# Patient Record
Sex: Male | Born: 1950
Health system: Southern US, Community
[De-identification: ages and names within clinical notes are randomized; demographics above are authoritative.]

---

## 2009-02-13 ENCOUNTER — Ambulatory Visit: Payer: Self-pay | Admitting: Internal Medicine

## 2010-02-17 ENCOUNTER — Ambulatory Visit: Payer: Self-pay | Admitting: Internal Medicine

## 2011-08-23 ENCOUNTER — Other Ambulatory Visit: Payer: Self-pay | Admitting: Internal Medicine

## 2011-08-25 ENCOUNTER — Encounter: Payer: Self-pay | Admitting: Internal Medicine

## 2011-09-15 ENCOUNTER — Other Ambulatory Visit: Payer: BC Managed Care – PPO | Admitting: Internal Medicine

## 2011-09-15 DIAGNOSIS — Z0000 Encounter for general adult medical examination without abnormal findings: Secondary | ICD-10-CM

## 2011-09-15 LAB — CBC WITH DIFFERENTIAL/PLATELET
Basophils Absolute: 0 10*3/uL (ref 0.0–0.1)
Lymphocytes Relative: 31 % (ref 12–46)
Neutro Abs: 3.6 10*3/uL (ref 1.7–7.7)
Platelets: 193 10*3/uL (ref 150–400)
RDW: 13.6 % (ref 11.5–15.5)
WBC: 6.2 10*3/uL (ref 4.0–10.5)

## 2011-09-15 LAB — COMPREHENSIVE METABOLIC PANEL
ALT: 53 U/L (ref 0–53)
AST: 38 U/L — ABNORMAL HIGH (ref 0–37)
Albumin: 4.4 g/dL (ref 3.5–5.2)
Calcium: 9.5 mg/dL (ref 8.4–10.5)
Chloride: 106 mEq/L (ref 96–112)
Creat: 1.05 mg/dL (ref 0.50–1.35)
Potassium: 4.3 mEq/L (ref 3.5–5.3)

## 2011-09-15 LAB — LIPID PANEL
Total CHOL/HDL Ratio: 3.6 Ratio
VLDL: 23 mg/dL (ref 0–40)

## 2011-09-16 ENCOUNTER — Other Ambulatory Visit: Payer: Self-pay | Admitting: Internal Medicine

## 2011-09-22 ENCOUNTER — Encounter: Payer: Self-pay | Admitting: Internal Medicine

## 2011-09-22 ENCOUNTER — Ambulatory Visit (INDEPENDENT_AMBULATORY_CARE_PROVIDER_SITE_OTHER): Payer: BC Managed Care – PPO | Admitting: Internal Medicine

## 2011-09-22 VITALS — BP 134/72 | HR 74 | Temp 97.8°F | Resp 16 | Ht 70.0 in | Wt 196.0 lb

## 2011-09-22 DIAGNOSIS — Z23 Encounter for immunization: Secondary | ICD-10-CM

## 2011-09-22 DIAGNOSIS — E785 Hyperlipidemia, unspecified: Secondary | ICD-10-CM

## 2011-09-22 DIAGNOSIS — Z0000 Encounter for general adult medical examination without abnormal findings: Secondary | ICD-10-CM

## 2011-09-22 LAB — POCT URINALYSIS DIPSTICK
Blood, UA: NEGATIVE
Glucose, UA: NEGATIVE
Spec Grav, UA: 1.02

## 2011-10-04 ENCOUNTER — Encounter: Payer: Self-pay | Admitting: Internal Medicine

## 2011-10-04 DIAGNOSIS — E785 Hyperlipidemia, unspecified: Secondary | ICD-10-CM | POA: Insufficient documentation
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
## 2011-10-04 NOTE — Patient Instructions (Signed)
Continue diet exercise and return in one year or as needed

## 2012-05-09 ENCOUNTER — Telehealth: Payer: Self-pay | Admitting: Internal Medicine

## 2012-05-09 NOTE — Telephone Encounter (Signed)
Erroneous patient pulled up.  Appointment given for 12/6, not a message needed.

## 2012-05-11 ENCOUNTER — Encounter: Payer: Self-pay | Admitting: Internal Medicine

## 2012-05-11 ENCOUNTER — Ambulatory Visit (INDEPENDENT_AMBULATORY_CARE_PROVIDER_SITE_OTHER): Payer: BC Managed Care – PPO | Admitting: Internal Medicine

## 2012-05-11 VITALS — BP 130/90 | HR 84 | Temp 98.0°F | Wt 198.0 lb

## 2012-05-11 DIAGNOSIS — M6740 Ganglion, unspecified site: Secondary | ICD-10-CM

## 2012-05-11 DIAGNOSIS — M67439 Ganglion, unspecified wrist: Secondary | ICD-10-CM

## 2012-05-11 NOTE — Progress Notes (Signed)
  Subjective:    Patient ID: James Hughes, male    DOB: 21-Oct-1950, 61 y.o.   MRN: [id number]  HPI Patient recently noted cystic lesion left wrist. Not tender to touch. Not painful. Appeared rather suddenly.    Review of Systems     Objective:   Physical Exam 1.5 cm cystic lesion consistent with ganglion cyst left wrist. Not red. Not inflamed. Not painful to touch. No increased warmth. Full range of motion left wrist       Assessment & Plan:  Ganglion cyst left wrist  Plan: Explained to patient that this is a benign lesion and can be left alone unless symptomatic. He is concerned it might become irritated using a computer mouse. Suggested he observe for now and let he know if he would like to see hand surgeon regarding excision in the future.

## 2012-05-11 NOTE — Patient Instructions (Addendum)
Continue close observation and advise [place] if you like to be referred for excision of cyst

## 2012-12-20 ENCOUNTER — Other Ambulatory Visit: Payer: BC Managed Care – PPO | Admitting: Internal Medicine

## 2012-12-20 DIAGNOSIS — Z125 Encounter for screening for malignant neoplasm of prostate: Secondary | ICD-10-CM

## 2012-12-20 DIAGNOSIS — Z13220 Encounter for screening for lipoid disorders: Secondary | ICD-10-CM

## 2012-12-20 DIAGNOSIS — Z130 Encounter for screening for diseases of the blood and blood-forming organs and certain disorders involving the immune mechanism: Secondary | ICD-10-CM

## 2012-12-20 DIAGNOSIS — Z0000 Encounter for general adult medical examination without abnormal findings: Secondary | ICD-10-CM

## 2012-12-20 LAB — CBC WITH DIFFERENTIAL/PLATELET
Eosinophils Absolute: 0.1 10*3/uL (ref 0.0–0.7)
Eosinophils Relative: 1 % (ref 0–5)
HCT: 42.8 % (ref 39.0–52.0)
Lymphocytes Relative: 31 % (ref 12–46)
Lymphs Abs: 1.7 10*3/uL (ref 0.7–4.0)
MCH: 32.1 pg (ref 26.0–34.0)
MCV: 90.5 fL (ref 78.0–100.0)
Monocytes Absolute: 0.4 10*3/uL (ref 0.1–1.0)
Monocytes Relative: 8 % (ref 3–12)
RBC: 4.73 MIL/uL (ref 4.22–5.81)
WBC: 5.5 10*3/uL (ref 4.0–10.5)

## 2012-12-20 LAB — COMPREHENSIVE METABOLIC PANEL
ALT: 38 U/L (ref 0–53)
BUN: 14 mg/dL (ref 6–23)
CO2: 23 mEq/L (ref 19–32)
Calcium: 9.3 mg/dL (ref 8.4–10.5)
Chloride: 106 mEq/L (ref 96–112)
Creat: 0.98 mg/dL (ref 0.50–1.35)
Glucose, Bld: 91 mg/dL (ref 70–99)

## 2012-12-20 LAB — LIPID PANEL: Cholesterol: 176 mg/dL (ref 0–200)

## 2012-12-21 LAB — PSA: PSA: 2.85 ng/mL (ref <=4.00)

## 2012-12-25 ENCOUNTER — Encounter: Payer: Self-pay | Admitting: Internal Medicine

## 2012-12-25 ENCOUNTER — Ambulatory Visit (INDEPENDENT_AMBULATORY_CARE_PROVIDER_SITE_OTHER): Payer: BC Managed Care – PPO | Admitting: Internal Medicine

## 2012-12-25 VITALS — BP 112/80 | HR 84 | Temp 97.1°F | Ht 70.5 in | Wt 186.0 lb

## 2012-12-25 DIAGNOSIS — Z0000 Encounter for general adult medical examination without abnormal findings: Secondary | ICD-10-CM

## 2012-12-25 LAB — POCT URINALYSIS DIPSTICK
Bilirubin, UA: NEGATIVE
Ketones, UA: NEGATIVE
Leukocytes, UA: NEGATIVE
Nitrite, UA: NEGATIVE
Protein, UA: NEGATIVE
pH, UA: 6.5

## 2013-06-02 NOTE — Patient Instructions (Signed)
Continue diet and exercise. Recheck in one year. Colonoscopy due this year with gastroenterologist
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
## 2014-03-12 ENCOUNTER — Encounter: Payer: Self-pay | Admitting: Internal Medicine

## 2014-06-20 ENCOUNTER — Other Ambulatory Visit: Payer: BLUE CROSS/BLUE SHIELD | Admitting: Internal Medicine

## 2014-06-20 DIAGNOSIS — Z125 Encounter for screening for malignant neoplasm of prostate: Secondary | ICD-10-CM

## 2014-06-20 DIAGNOSIS — Z130 Encounter for screening for diseases of the blood and blood-forming organs and certain disorders involving the immune mechanism: Secondary | ICD-10-CM

## 2014-06-20 DIAGNOSIS — Z0000 Encounter for general adult medical examination without abnormal findings: Secondary | ICD-10-CM

## 2014-06-20 DIAGNOSIS — Z13220 Encounter for screening for lipoid disorders: Secondary | ICD-10-CM

## 2014-06-20 LAB — LIPID PANEL
CHOL/HDL RATIO: 3.4 ratio
CHOLESTEROL: 168 mg/dL (ref 0–200)
HDL: 49 mg/dL (ref >39)
LDL CALC: 95 mg/dL (ref 0–99)
TRIGLYCERIDES: 122 mg/dL (ref <150)
VLDL: 24 mg/dL (ref 0–40)

## 2014-06-20 LAB — COMPREHENSIVE METABOLIC PANEL
ALK PHOS: 65 U/L (ref 39–117)
ALT: 39 U/L (ref 0–53)
AST: 28 U/L (ref 0–37)
Albumin: 4 g/dL (ref 3.5–5.2)
BUN: 11 mg/dL (ref 6–23)
CO2: 25 meq/L (ref 19–32)
Calcium: 9.3 mg/dL (ref 8.4–10.5)
Chloride: 105 mEq/L (ref 96–112)
Creat: 0.94 mg/dL (ref 0.50–1.35)
Glucose, Bld: 98 mg/dL (ref 70–99)
POTASSIUM: 4.4 meq/L (ref 3.5–5.3)
SODIUM: 141 meq/L (ref 135–145)
TOTAL PROTEIN: 6.7 g/dL (ref 6.0–8.3)
Total Bilirubin: 1 mg/dL (ref 0.2–1.2)

## 2014-06-20 LAB — CBC WITH DIFFERENTIAL/PLATELET
BASOS ABS: 0.1 10*3/uL (ref 0.0–0.1)
Basophils Relative: 1 % (ref 0–1)
EOS PCT: 2 % (ref 0–5)
Eosinophils Absolute: 0.1 10*3/uL (ref 0.0–0.7)
HCT: 47 % (ref 39.0–52.0)
Hemoglobin: 15.9 g/dL (ref 13.0–17.0)
LYMPHS ABS: 1.4 10*3/uL (ref 0.7–4.0)
Lymphocytes Relative: 27 % (ref 12–46)
MCH: 31.1 pg (ref 26.0–34.0)
MCHC: 33.8 g/dL (ref 30.0–36.0)
MCV: 91.8 fL (ref 78.0–100.0)
MONO ABS: 0.5 10*3/uL (ref 0.1–1.0)
MONOS PCT: 9 % (ref 3–12)
MPV: 9.4 fL (ref 8.6–12.4)
NEUTROS PCT: 61 % (ref 43–77)
Neutro Abs: 3.1 10*3/uL (ref 1.7–7.7)
Platelets: 196 10*3/uL (ref 150–400)
RBC: 5.12 MIL/uL (ref 4.22–5.81)
RDW: 13.3 % (ref 11.5–15.5)
WBC: 5.1 10*3/uL (ref 4.0–10.5)

## 2014-06-21 LAB — PSA: PSA: 2.19 ng/mL (ref <=4.00)

## 2014-06-24 ENCOUNTER — Encounter: Payer: Self-pay | Admitting: Internal Medicine

## 2014-06-24 ENCOUNTER — Ambulatory Visit (INDEPENDENT_AMBULATORY_CARE_PROVIDER_SITE_OTHER): Payer: BLUE CROSS/BLUE SHIELD | Admitting: Internal Medicine

## 2014-06-24 VITALS — BP 136/76 | HR 72 | Temp 97.7°F | Wt 190.5 lb

## 2014-06-24 DIAGNOSIS — Z0000 Encounter for general adult medical examination without abnormal findings: Secondary | ICD-10-CM

## 2014-06-24 LAB — POCT URINALYSIS DIPSTICK
BILIRUBIN UA: NEGATIVE
Blood, UA: NEGATIVE
GLUCOSE UA: NEGATIVE
Ketones, UA: NEGATIVE
LEUKOCYTES UA: NEGATIVE
NITRITE UA: NEGATIVE
Protein, UA: NEGATIVE
Spec Grav, UA: 1.02
UROBILINOGEN UA: NEGATIVE
pH, UA: 7
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
## 2014-06-24 NOTE — Patient Instructions (Signed)
Come get Prevnar vaccine in a couple of weeks. Otherwise return in one year or as needed.

## 2014-06-25 ENCOUNTER — Encounter: Payer: Self-pay | Admitting: Internal Medicine

## 2014-07-03 ENCOUNTER — Encounter: Payer: Self-pay | Admitting: Internal Medicine

## 2014-07-11 ENCOUNTER — Ambulatory Visit (INDEPENDENT_AMBULATORY_CARE_PROVIDER_SITE_OTHER): Payer: BLUE CROSS/BLUE SHIELD | Admitting: Internal Medicine

## 2014-07-11 VITALS — BP 140/72 | Temp 97.8°F

## 2014-07-11 DIAGNOSIS — Z23 Encounter for immunization: Secondary | ICD-10-CM

## 2014-07-11 NOTE — Progress Notes (Signed)
Patient presents today for Prevnar vaccine . Tolerated injection well

## 2015-03-23 ENCOUNTER — Ambulatory Visit (INDEPENDENT_AMBULATORY_CARE_PROVIDER_SITE_OTHER): Payer: BLUE CROSS/BLUE SHIELD | Admitting: Internal Medicine

## 2015-03-23 ENCOUNTER — Encounter: Payer: Self-pay | Admitting: Internal Medicine

## 2015-03-23 VITALS — BP 152/88 | HR 89 | Temp 97.5°F | Wt 178.0 lb

## 2015-03-23 DIAGNOSIS — J069 Acute upper respiratory infection, unspecified: Secondary | ICD-10-CM

## 2015-03-23 DIAGNOSIS — H6501 Acute serous otitis media, right ear: Secondary | ICD-10-CM | POA: Diagnosis not present

## 2015-03-23 MED ORDER — AZITHROMYCIN 250 MG PO TABS: ORAL_TABLET | ORAL | Status: DC

## 2015-03-23 NOTE — Progress Notes (Signed)
   Subjective:    Patient ID: James Hughes, male    DOB: Oct 07, 1950, 64 y.o.   MRN: [id number]  HPI Patient has come down with URI symptoms. Has malaise and fatigue and some scratchy throat with postnasal drip. No fever or cough.    Review of Systems     Objective:   Physical Exam Pharynx slightly injected without exudate. Right TM is full but not red. Left TM is clear. Neck is supple without adenopathy. Chest clear to auscultation.       Assessment & Plan:  Acute URI  Acute right serous otitis media    Plan: Zithromax Z-PAK take 2 tablets day one followed by 1 tablet days 2 through 5. Call if not better in 7-10 days or sooner if worse. Patient declined offer for cough medication.  15 minutes spent with patient

## 2015-03-23 NOTE — Patient Instructions (Signed)
It was pleasure to see you today. Takes Zithromax Z-Pak is prescribed. Call if not better in 7-10 days or sooner if worse. Rest and drink plenty of fluids.

## 2016-03-14 ENCOUNTER — Other Ambulatory Visit (INDEPENDENT_AMBULATORY_CARE_PROVIDER_SITE_OTHER): Payer: BLUE CROSS/BLUE SHIELD | Admitting: Internal Medicine

## 2016-03-14 DIAGNOSIS — L814 Other melanin hyperpigmentation: Secondary | ICD-10-CM | POA: Diagnosis not present

## 2016-03-14 DIAGNOSIS — Z125 Encounter for screening for malignant neoplasm of prostate: Secondary | ICD-10-CM

## 2016-03-14 DIAGNOSIS — Z1329 Encounter for screening for other suspected endocrine disorder: Secondary | ICD-10-CM

## 2016-03-14 DIAGNOSIS — Z131 Encounter for screening for diabetes mellitus: Secondary | ICD-10-CM | POA: Diagnosis not present

## 2016-03-14 DIAGNOSIS — Z86018 Personal history of other benign neoplasm: Secondary | ICD-10-CM | POA: Diagnosis not present

## 2016-03-14 DIAGNOSIS — Z23 Encounter for immunization: Secondary | ICD-10-CM | POA: Diagnosis not present

## 2016-03-14 DIAGNOSIS — D225 Melanocytic nevi of trunk: Secondary | ICD-10-CM | POA: Diagnosis not present

## 2016-03-14 DIAGNOSIS — D1801 Hemangioma of skin and subcutaneous tissue: Secondary | ICD-10-CM | POA: Diagnosis not present

## 2016-03-14 DIAGNOSIS — L821 Other seborrheic keratosis: Secondary | ICD-10-CM | POA: Diagnosis not present

## 2016-03-14 LAB — CBC WITH DIFFERENTIAL/PLATELET
BASOS PCT: 1 %
Basophils Absolute: 51 cells/uL (ref 0–200)
Eosinophils Absolute: 51 cells/uL (ref 15–500)
Eosinophils Relative: 1 %
HCT: 46.7 % (ref 38.5–50.0)
Hemoglobin: 16 g/dL (ref 13.2–17.1)
LYMPHS PCT: 27 %
Lymphs Abs: 1377 cells/uL (ref 850–3900)
MCH: 32.3 pg (ref 27.0–33.0)
MCHC: 34.3 g/dL (ref 32.0–36.0)
MCV: 94.2 fL (ref 80.0–100.0)
MONO ABS: 459 {cells}/uL (ref 200–950)
MPV: 9.8 fL (ref 7.5–12.5)
Monocytes Relative: 9 %
Neutro Abs: 3162 cells/uL (ref 1500–7800)
Neutrophils Relative %: 62 %
PLATELETS: 190 10*3/uL (ref 140–400)
RBC: 4.96 MIL/uL (ref 4.20–5.80)
RDW: 13 % (ref 11.0–15.0)
WBC: 5.1 10*3/uL (ref 3.8–10.8)

## 2016-03-14 LAB — COMPREHENSIVE METABOLIC PANEL
ALBUMIN: 4.2 g/dL (ref 3.6–5.1)
ALT: 47 U/L — ABNORMAL HIGH (ref 9–46)
AST: 37 U/L — ABNORMAL HIGH (ref 10–35)
Alkaline Phosphatase: 56 U/L (ref 40–115)
BUN: 16 mg/dL (ref 7–25)
CALCIUM: 9.4 mg/dL (ref 8.6–10.3)
CHLORIDE: 103 mmol/L (ref 98–110)
CO2: 24 mmol/L (ref 20–31)
CREATININE: 1.09 mg/dL (ref 0.70–1.25)
Glucose, Bld: 82 mg/dL (ref 65–99)
POTASSIUM: 4.4 mmol/L (ref 3.5–5.3)
SODIUM: 140 mmol/L (ref 135–146)
TOTAL PROTEIN: 6.8 g/dL (ref 6.1–8.1)
Total Bilirubin: 2.2 mg/dL — ABNORMAL HIGH (ref 0.2–1.2)

## 2016-03-14 LAB — LIPID PANEL
Cholesterol: 179 mg/dL (ref 125–200)
HDL: 72 mg/dL (ref >=40)
LDL Cholesterol: 89 mg/dL (ref <130)
TRIGLYCERIDES: 92 mg/dL (ref <150)
Total CHOL/HDL Ratio: 2.5 Ratio (ref <=5.0)
VLDL: 18 mg/dL (ref <30)

## 2016-03-14 LAB — PSA: PSA: 3 ng/mL (ref <=4.0)

## 2016-03-15 ENCOUNTER — Ambulatory Visit (INDEPENDENT_AMBULATORY_CARE_PROVIDER_SITE_OTHER): Payer: Medicare Other | Admitting: Internal Medicine

## 2016-03-15 ENCOUNTER — Encounter: Payer: Self-pay | Admitting: Internal Medicine

## 2016-03-15 VITALS — BP 138/90 | HR 87 | Ht 70.0 in | Wt 172.5 lb

## 2016-03-15 DIAGNOSIS — R17 Unspecified jaundice: Secondary | ICD-10-CM

## 2016-03-15 DIAGNOSIS — Z23 Encounter for immunization: Secondary | ICD-10-CM | POA: Diagnosis not present

## 2016-03-15 DIAGNOSIS — Z0000 Encounter for general adult medical examination without abnormal findings: Secondary | ICD-10-CM | POA: Diagnosis not present

## 2016-03-15 NOTE — Patient Instructions (Addendum)
It was a pleasure to see you today. Have ultrasound of liver. Further recommendations to follow.
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
## 2016-03-16 ENCOUNTER — Other Ambulatory Visit: Payer: Self-pay | Admitting: *Deleted

## 2016-03-16 ENCOUNTER — Telehealth: Payer: Self-pay | Admitting: *Deleted

## 2016-03-16 DIAGNOSIS — R7989 Other specified abnormal findings of blood chemistry: Secondary | ICD-10-CM

## 2016-03-16 DIAGNOSIS — R945 Abnormal results of liver function studies: Principal | ICD-10-CM

## 2016-03-16 NOTE — Telephone Encounter (Signed)
lvm informing pt of ultrasound appt. [place] location[facility] 03/28/16 at 10am Nothing to eat or drink after midnight the night before.

## 2016-03-28 ENCOUNTER — Ambulatory Visit
Admission: RE | Admit: 2016-03-28 | Discharge: 2016-03-28 | Disposition: A | Discharge status: Home / Self Care | Payer: Medicare Other | Source: Ambulatory Visit | Attending: Internal Medicine | Admitting: Internal Medicine

## 2016-03-28 DIAGNOSIS — R7989 Other specified abnormal findings of blood chemistry: Secondary | ICD-10-CM

## 2016-03-28 DIAGNOSIS — K7689 Other specified diseases of liver: Secondary | ICD-10-CM | POA: Diagnosis not present

## 2016-03-28 DIAGNOSIS — R945 Abnormal results of liver function studies: Principal | ICD-10-CM

## 2016-03-29 ENCOUNTER — Telehealth: Payer: Self-pay | Admitting: *Deleted

## 2016-03-29 NOTE — Telephone Encounter (Signed)
Pt informed, appt made

## 2016-03-29 NOTE — Telephone Encounter (Signed)
-----   Message from Elby Showers, MD sent at 03/28/2016  7:16 PM EDT ----- Ultrasound of liver shows benign cyst. Not a reason for elevated LFTs. Suggest we repeat LFT's without office visit in 4 weeks.

## 2016-04-05 DIAGNOSIS — H524 Presbyopia: Secondary | ICD-10-CM | POA: Diagnosis not present

## 2016-04-05 DIAGNOSIS — H52223 Regular astigmatism, bilateral: Secondary | ICD-10-CM | POA: Diagnosis not present

## 2016-04-05 DIAGNOSIS — H5203 Hypermetropia, bilateral: Secondary | ICD-10-CM | POA: Diagnosis not present

## 2016-04-11 ENCOUNTER — Other Ambulatory Visit: Payer: Medicare Other | Admitting: Internal Medicine

## 2016-04-11 DIAGNOSIS — R7989 Other specified abnormal findings of blood chemistry: Secondary | ICD-10-CM | POA: Diagnosis not present

## 2016-04-11 DIAGNOSIS — R17 Unspecified jaundice: Secondary | ICD-10-CM

## 2016-04-11 DIAGNOSIS — R945 Abnormal results of liver function studies: Principal | ICD-10-CM

## 2016-04-11 LAB — HEPATIC FUNCTION PANEL
ALT: 43 U/L (ref 9–46)
AST: 35 U/L (ref 10–35)
Albumin: 4 g/dL (ref 3.6–5.1)
Alkaline Phosphatase: 51 U/L (ref 40–115)
BILIRUBIN DIRECT: 0.2 mg/dL (ref <=0.2)
BILIRUBIN INDIRECT: 1 mg/dL (ref 0.2–1.2)
BILIRUBIN TOTAL: 1.2 mg/dL (ref 0.2–1.2)
Total Protein: 6.3 g/dL (ref 6.1–8.1)

## 2016-04-11 LAB — BILIRUBIN, FRACTIONATED(TOT/DIR/INDIR)
Bilirubin, Direct: 0.2 mg/dL (ref <=0.2)
Indirect Bilirubin: 1 mg/dL (ref 0.2–1.2)
Total Bilirubin: 1.2 mg/dL (ref 0.2–1.2)

## 2016-04-11 LAB — BILIRUBIN,DIRECT & INDIRECT (FRACTIONATED)
BILIRUBIN DIRECT: 0.2 mg/dL (ref <=0.2)
BILIRUBIN INDIRECT: 1 mg/dL (ref 0.2–1.2)

## 2016-07-11 ENCOUNTER — Encounter: Payer: Self-pay | Admitting: Internal Medicine

## 2016-07-11 ENCOUNTER — Ambulatory Visit (INDEPENDENT_AMBULATORY_CARE_PROVIDER_SITE_OTHER): Payer: Medicare Other | Admitting: Internal Medicine

## 2016-07-11 VITALS — BP 150/90 | HR 92 | Temp 98.0°F

## 2016-07-11 DIAGNOSIS — Z23 Encounter for immunization: Secondary | ICD-10-CM

## 2016-07-11 DIAGNOSIS — R030 Elevated blood-pressure reading, without diagnosis of hypertension: Secondary | ICD-10-CM

## 2016-07-11 NOTE — Patient Instructions (Signed)
Keep an eye on blood pressure the next couple weeks and call with readings. Pneumococcal 23 vaccine given.

## 2016-07-11 NOTE — Progress Notes (Signed)
   Subjective:    Patient ID: James Hughes, male    DOB: 1950-11-09, 66 y.o.   MRN: [id number]  HPI  For pneumococcal vaccine today. Had 2 large cups of coffee before coming. BP elevated at 150/90. He has no prior history of hypertension. He is in very good health. He exercises regularly.    Review of Systems     Objective:   Physical Exam  As above      Assessment & Plan:  Elevated blood pressure  Pneumococcal 23 vaccine  Plan: He's to keep an eye on his blood pressure and call me with some readings in a couple of weeks.

## 2017-03-07 DIAGNOSIS — Z23 Encounter for immunization: Secondary | ICD-10-CM | POA: Diagnosis not present

## 2017-03-15 DIAGNOSIS — L814 Other melanin hyperpigmentation: Secondary | ICD-10-CM | POA: Diagnosis not present

## 2017-03-15 DIAGNOSIS — D1801 Hemangioma of skin and subcutaneous tissue: Secondary | ICD-10-CM | POA: Diagnosis not present

## 2017-03-15 DIAGNOSIS — Z86018 Personal history of other benign neoplasm: Secondary | ICD-10-CM | POA: Diagnosis not present

## 2017-03-15 DIAGNOSIS — D225 Melanocytic nevi of trunk: Secondary | ICD-10-CM | POA: Diagnosis not present

## 2017-03-15 DIAGNOSIS — L821 Other seborrheic keratosis: Secondary | ICD-10-CM | POA: Diagnosis not present

## 2017-03-15 DIAGNOSIS — Z23 Encounter for immunization: Secondary | ICD-10-CM | POA: Diagnosis not present

## 2017-03-17 DIAGNOSIS — H43393 Other vitreous opacities, bilateral: Secondary | ICD-10-CM | POA: Diagnosis not present

## 2017-03-18 ENCOUNTER — Emergency Department (HOSPITAL_COMMUNITY): Payer: Medicare Other

## 2017-03-18 ENCOUNTER — Inpatient Hospital Stay (HOSPITAL_COMMUNITY)
Admission: EM | Admit: 2017-03-18 | Discharge: 2017-03-20 | DRG: 176 | Disposition: A | Discharge status: Home / Self Care | Payer: Medicare Other | Attending: Internal Medicine | Admitting: Internal Medicine

## 2017-03-18 ENCOUNTER — Encounter (HOSPITAL_COMMUNITY): Payer: Self-pay | Admitting: Emergency Medicine

## 2017-03-18 DIAGNOSIS — R778 Other specified abnormalities of plasma proteins: Secondary | ICD-10-CM

## 2017-03-18 DIAGNOSIS — Z880 Allergy status to penicillin: Secondary | ICD-10-CM

## 2017-03-18 DIAGNOSIS — I10 Essential (primary) hypertension: Secondary | ICD-10-CM

## 2017-03-18 DIAGNOSIS — R748 Abnormal levels of other serum enzymes: Secondary | ICD-10-CM

## 2017-03-18 DIAGNOSIS — Z86711 Personal history of pulmonary embolism: Secondary | ICD-10-CM | POA: Diagnosis not present

## 2017-03-18 DIAGNOSIS — I248 Other forms of acute ischemic heart disease: Secondary | ICD-10-CM | POA: Diagnosis not present

## 2017-03-18 DIAGNOSIS — R7989 Other specified abnormal findings of blood chemistry: Secondary | ICD-10-CM

## 2017-03-18 DIAGNOSIS — R000 Tachycardia, unspecified: Secondary | ICD-10-CM | POA: Diagnosis not present

## 2017-03-18 DIAGNOSIS — R030 Elevated blood-pressure reading, without diagnosis of hypertension: Secondary | ICD-10-CM | POA: Diagnosis present

## 2017-03-18 DIAGNOSIS — I2699 Other pulmonary embolism without acute cor pulmonale: Principal | ICD-10-CM | POA: Diagnosis present

## 2017-03-18 DIAGNOSIS — R0602 Shortness of breath: Secondary | ICD-10-CM | POA: Diagnosis not present

## 2017-03-18 HISTORY — DX: Other pulmonary embolism without acute cor pulmonale: I26.99

## 2017-03-18 HISTORY — DX: Essential (primary) hypertension: I10

## 2017-03-18 LAB — CBC WITH DIFFERENTIAL/PLATELET
Basophils Absolute: 0 10*3/uL (ref 0.0–0.1)
Basophils Relative: 0 %
Eosinophils Absolute: 0 10*3/uL (ref 0.0–0.7)
Eosinophils Relative: 0 %
HCT: 44.7 % (ref 39.0–52.0)
Hemoglobin: 15.7 g/dL (ref 13.0–17.0)
Lymphocytes Relative: 11 %
Lymphs Abs: 1 10*3/uL (ref 0.7–4.0)
MCH: 31.7 pg (ref 26.0–34.0)
MCHC: 35.1 g/dL (ref 30.0–36.0)
MCV: 90.3 fL (ref 78.0–100.0)
Monocytes Absolute: 0.8 10*3/uL (ref 0.1–1.0)
Monocytes Relative: 8 %
Neutro Abs: 7.4 10*3/uL (ref 1.7–7.7)
Neutrophils Relative %: 81 %
Platelets: 159 10*3/uL (ref 150–400)
RBC: 4.95 MIL/uL (ref 4.22–5.81)
RDW: 12.2 % (ref 11.5–15.5)
WBC: 9.2 10*3/uL (ref 4.0–10.5)

## 2017-03-18 LAB — BASIC METABOLIC PANEL
Anion gap: 11 (ref 5–15)
BUN: 13 mg/dL (ref 6–20)
CO2: 23 mmol/L (ref 22–32)
Calcium: 9.1 mg/dL (ref 8.9–10.3)
Chloride: 104 mmol/L (ref 101–111)
Creatinine, Ser: 0.95 mg/dL (ref 0.61–1.24)
GFR calc Af Amer: >60 mL/min (ref >60)
GFR calc non Af Amer: >60 mL/min (ref >60)
Glucose, Bld: 122 mg/dL — ABNORMAL HIGH (ref 65–99)
Potassium: 3.7 mmol/L (ref 3.5–5.1)
Sodium: 138 mmol/L (ref 135–145)

## 2017-03-18 LAB — BRAIN NATRIURETIC PEPTIDE: B NATRIURETIC PEPTIDE 5: 41.3 pg/mL (ref 0.0–100.0)

## 2017-03-18 LAB — APTT: aPTT: 154 seconds — ABNORMAL HIGH (ref 24–36)

## 2017-03-18 LAB — PROTIME-INR
INR: 1.25
PROTHROMBIN TIME: 15.6 s — AB (ref 11.4–15.2)

## 2017-03-18 LAB — TROPONIN I
TROPONIN I: 0.22 ng/mL — AB (ref <0.03)
Troponin I: 0.13 ng/mL (ref <0.03)

## 2017-03-18 LAB — HEPARIN LEVEL (UNFRACTIONATED): Heparin Unfractionated: 0.57 IU/mL (ref 0.30–0.70)

## 2017-03-18 LAB — MRSA PCR SCREENING: MRSA by PCR: NEGATIVE

## 2017-03-18 MED ORDER — HEPARIN (PORCINE) IN NACL 100-0.45 UNIT/ML-% IJ SOLN
1200.0000 [IU]/h | INTRAMUSCULAR | Status: AC
  Administered 2017-03-18 – 2017-03-20 (×3): 1200 [IU]/h via INTRAVENOUS
  Filled 2017-03-18 (×3): qty 250

## 2017-03-18 MED ORDER — RIVAROXABAN 15 MG PO TABS
15.0000 mg | ORAL_TABLET | Freq: Two times a day (BID) | ORAL | Status: DC
  Filled 2017-03-18: qty 1

## 2017-03-18 MED ORDER — IOPAMIDOL (ISOVUE-370) INJECTION 76%
INTRAVENOUS | Status: AC
  Administered 2017-03-18: 100 mL via INTRAVENOUS
  Filled 2017-03-18: qty 100

## 2017-03-18 MED ORDER — HEPARIN BOLUS VIA INFUSION
3000.0000 [IU] | Freq: Once | INTRAVENOUS | Status: AC
  Administered 2017-03-18: 3000 [IU] via INTRAVENOUS
  Filled 2017-03-18: qty 3000

## 2017-03-18 MED ORDER — SODIUM CHLORIDE 0.9 % IV SOLN
INTRAVENOUS | Status: DC
  Administered 2017-03-18 – 2017-03-20 (×5): via INTRAVENOUS

## 2017-03-18 NOTE — Progress Notes (Addendum)
ANTICOAGULATION CONSULT NOTE - Initial Consult  Pharmacy Consult for Xarelto (changed to Heparin infusion per pharmacy) Indication: pulmonary embolus  Allergies  Allergen Reactions  . Penicillins Hives    Has patient had a PCN reaction causing immediate rash, facial/tongue/throat swelling, SOB or lightheadedness with hypotension: No Has patient had a PCN reaction causing severe rash involving mucus membranes or skin necrosis: No Has patient had a PCN reaction that required hospitalization: No Has patient had a PCN reaction occurring within the last 10 years: No If all of the above answers are "NO", then may proceed with Cephalosporin use.   . Sulfa Antibiotics Hives    Patient Measurements: Height: 5\' 10"  (177.8 cm) Weight: 160 lb (72.6 kg) IBW/kg (Calculated) : 73  Vital Signs: Temp: 97.8 F (36.6 C) (10/13 1026) Temp Source: Oral (10/13 1026) BP: 156/93 (10/13 1026) Pulse Rate: 107 (10/13 1026)  Labs:  Recent Labs  03/18/17 1123  HGB 15.7  HCT 44.7  PLT 159  CREATININE 0.95  TROPONINI 0.13*    Estimated Creatinine Clearance: 76.4 mL/min (by C-G formula based on SCr of 0.95 mg/dL).   Medical History: History reviewed. No pertinent past medical history.   Assessment: 58 yoM presents with shortness of breath. CTA chest shows bilateral lobar and segmental pulmonary emboli. Overall clot burden is moderate. No evidence of right heart strain. Pharmacy consulted to dose Xarelto.  Not on any anticoagulants PTA. SCr and CBC WNL.  Goal of Therapy:  VTE Treatment   Plan:  Xarelto 15 mg BID with meals x 21 days then start 20 mg daily with evening meal on 11/3.   Hershal Coria 03/18/2017,1:40 PM    Addendum: 03/18/2017 2:01 PM During Xarelto education, decision was made to start heparin infusion instead due to tachycardia.  ECHO ordered.  First dose of Xarelto was not given.  Did provide patient with Xarelto discount card since will likely transition to Xarelto per  discussion with admitting MD.  Plan: Heparin 3000 unit bolus then start infusion at 1200 units/hr. Check heparin level in 6 hours. Daily heparin level and CBC while on heparin infusion.    Hershal Coria, PharmD, BCPS Pager: [phone] 03/18/2017 2:04 PM

## 2017-03-18 NOTE — ED Provider Notes (Signed)
[facility] DEPT Provider Note   CSN: [id number] Arrival date & time: 03/18/17  0915     History   Chief Complaint Chief Complaint  Patient presents with  . Shortness of Breath  . Fatigue    HPI James Hughes is a 66 y.o. male.  HPI   66 year old male with dyspnea. Worsening over the past several weeks and significantly worse today. Patient has had approximately 30 pound intentional weight loss in the past year since retirement. He exercises on a regular basis by either biking or walking. He reports that he can normally walk 3 or 4 miles with little difficulty. Today he felt extremely short of breath even going downhill to the point where he had to stop and have a neighbor drive him back home. He denies any pain.  On review of systems, he reports "cold symptoms" for the past few weeks. This includes sore throat, postnasal drip and nonproductive cough.  No unusual leg pain or swelling. Denies any past history of DVT/PE. No recent surgical procedures. No known cancer. He has flown to [place] and back several times in the past few weeks. Denies any known cardiac history.  He reports his father died of a PE but this was after significant trauma and months of subsequent bedrest.   History reviewed. No pertinent past medical history.  There are no active problems to display for this patient.   History reviewed. No pertinent surgical history.     Home Medications    Prior to Admission medications   Medication Sig Start Date End Date Taking? Authorizing Provider  aspirin EC 81 MG tablet Take 81 mg by mouth daily.    [provider]  cetirizine (ZYRTEC) 10 MG tablet Take 10 mg by mouth daily as needed for allergies.    [provider]    Family History Family History  Problem Relation Age of Onset  . Cancer Mother   . Pulmonary embolism Father     Social History Social History  Substance Use Topics  . Smoking status: Never Smoker  .  Smokeless tobacco: Never Used  . Alcohol use 1.8 oz/week    3 Glasses of wine per week     Allergies   Penicillins and Sulfa antibiotics   Review of Systems Review of Systems  All systems reviewed and negative, other than as noted in HPI.   Physical Exam Updated Vital Signs BP (!) 171/101   Pulse (!) 126   Temp (!) 97.5 F (36.4 C) (Oral)   Resp 19   Ht 5\' 10"  (1.778 m)   Wt 72.6 kg (160 lb)   SpO2 95%   BMI 22.96 kg/m   Physical Exam  Constitutional: He appears well-developed and well-nourished. No distress.  HENT:  Head: Normocephalic and atraumatic.  Eyes: Conjunctivae are normal. Right eye exhibits no discharge. Left eye exhibits no discharge.  Neck: Neck supple.  Cardiovascular: Regular rhythm and normal heart sounds.  Exam reveals no gallop and no friction rub.   No murmur heard. tachycardia  Pulmonary/Chest: Effort normal and breath sounds normal. No respiratory distress.  Abdominal: Soft. He exhibits no distension. There is no tenderness.  Musculoskeletal: He exhibits no edema or tenderness.  Lower extremities symmetric as compared to each other. No calf tenderness. Negative Homan's. No palpable cords.   Neurological: He is alert.  Skin: Skin is warm and dry.  Psychiatric: He has a normal mood and affect. His behavior is normal. Thought content normal.  Nursing note and  vitals reviewed.    ED Treatments / Results  Labs (all labs ordered are listed, but only abnormal results are displayed) Labs Reviewed  TROPONIN I - Abnormal; Notable for the following:       Result Value   Troponin I 0.13 (*)    All other components within normal limits  BASIC METABOLIC PANEL - Abnormal; Notable for the following:    Glucose, Bld 122 (*)    All other components within normal limits  CBC WITH DIFFERENTIAL/PLATELET  ANTITHROMBIN III  PROTEIN C ACTIVITY  PROTEIN C, TOTAL  PROTEIN S ACTIVITY  PROTEIN S, TOTAL  LUPUS ANTICOAGULANT PANEL  BETA-2-GLYCOPROTEIN I  ABS, IGG/M/A  HOMOCYSTEINE  FACTOR 5 LEIDEN  PROTHROMBIN GENE MUTATION  CARDIOLIPIN ANTIBODIES, IGG, IGM, IGA    EKG  EKG Interpretation  Date/Time:  Saturday March 18 2017 09:25:41 EDT Ventricular Rate:  124 PR Interval:    QRS Duration: 85 QT Interval:  312 QTC Calculation: 449 R Axis:   63 Text Interpretation:  Sinus tachycardia Consider right atrial enlargement Minimal ST depression, diffuse leads No old tracing to compare Confirmed by Virgel Manifold [phone]) on 03/18/2017 9:58:37 AM       Radiology Ct Angio Chest Pe W And/or Wo Contrast  Result Date: 03/18/2017 CLINICAL DATA:  Shortness of breath, fatigue, cold symptoms, PE suspected EXAM: CT ANGIOGRAPHY CHEST WITH CONTRAST TECHNIQUE: Multidetector CT imaging of the chest was performed using the standard protocol during bolus administration of intravenous contrast. Multiplanar CT image reconstructions and MIPs were obtained to evaluate the vascular anatomy. CONTRAST:  100 mL Isovue 370 IV COMPARISON:  None. FINDINGS: Cardiovascular: Satisfactory opacification the bilateral pulmonary artery to the lobar level. Lobar and segmental pulmonary emboli on the left, including at the origin of the left pulmonary artery (series 6/ image 120) and at the bifurcation of the lingular and left lower lobe pulmonary arteries (series 6/image 128). Lobar and segmental pulmonary emboli on the right, including at the origin of the superior segment right lower lobe pulmonary artery (series 6/ image 125), at the bifurcation of the right lower lobe pulmonary artery (series 6/ image 141), and proximally in the right middle lobe pulmonary artery (series 6/ image 148). Overall clot burden is moderate. No evidence of right heart strain. RV to LV ratio equals 0.83. The heart is normal in size.  No pericardial effusion. No evidence of thoracic aortic aneurysm or dissection. Mediastinum/Nodes: No suspicious mediastinal lymphadenopathy. Visualized thyroid is  unremarkable. Lungs/Pleura: No suspicious pulmonary nodules. Very mild patchy opacity in the posterior right lower lobe (series 5/image 57), possibly atelectasis, although early pulmonary infarct is possible. No pleural effusion or pneumothorax. Upper Abdomen: Visualized upper abdomen is unremarkable. Musculoskeletal: Visualized osseous structures are within normal limits. Review of the MIP images confirms the above findings. IMPRESSION: Bilateral lobar and segmental pulmonary emboli, as described above. Overall clot burden is moderate. No evidence of right heart strain. Critical Value/emergent results were called by telephone at the time of interpretation on 03/18/2017 at 1:13 pm to Dr. Virgel Manifold, who verbally acknowledged these results. Electronically Signed   By: Julian Hy M.D.   On: 03/18/2017 13:13    Procedures Procedures (including critical care time)  CRITICAL CARE Performed by: Virgel Manifold Total critical care time: 35 minutes Critical care time was exclusive of separately billable procedures and treating other patients. Critical care was necessary to treat or prevent imminent or life-threatening deterioration. Critical care was time spent personally by me on the following activities: development of  treatment plan with patient and/or surrogate as well as nursing, discussions with consultants, evaluation of patient's response to treatment, examination of patient, obtaining history from patient or surrogate, ordering and performing treatments and interventions, ordering and review of laboratory studies, ordering and review of radiographic studies, pulse oximetry and re-evaluation of patient's condition.   Medications Ordered in ED Medications  0.9 %  sodium chloride infusion (not administered)     Initial Impression / Assessment and Plan / ED Course  I have reviewed the triage vital signs and the nursing notes.  Pertinent labs & imaging results that were available during  my care of the patient were reviewed by me and considered in my medical decision making (see chart for details).    66yM with dyspnea. High clinical suspicion for PE. Resting tachycardia. EKG with some changes which are nonspecific but suggestive (mild diffuse ST depression, possible RAE) which is changed from previous.  Denies pain. Seems atypical for ACS. Sounds clear. Clinically not volume overloaded. Multiple recent plane flights to/from [place] in the past several weeks. Denies BRBPR or melena. No blood thinners and not hypotensive. Will CTa.    12:43 PM Pt updated. BMP just resulted so should be going to CT soon hopefully. No new complaints. Says he actually feels better after IVF.   1:24 PM Does have PE. Only risk factors I can clearly I can identify are recent travel which has been domestic. Will check hypercoaguable panel. Troponin minimally elevated which is likely from strain. Admit.    Final Clinical Impressions(s) / ED Diagnoses   Final diagnoses:  Other acute pulmonary embolism without acute cor pulmonale ([place])    New Prescriptions New Prescriptions   No medications on file     Virgel Manifold, MD 03/18/17 1357

## 2017-03-18 NOTE — ED Notes (Signed)
Date and time results received: 03/18/17 1258 (use smartphrase ".now" to insert current time)  Test: Troponin Critical Value: 0.13  Name of Provider Notified: Wilson Singer, MD  Orders Received? Or Actions Taken?: Notified MD

## 2017-03-18 NOTE — ED Notes (Signed)
Called Lab to confirm Lab draw.

## 2017-03-18 NOTE — Discharge Instructions (Addendum)
Information on my medicine - XARELTO (rivaroxaban)  This medication education was reviewed with me or my healthcare representative as part of my discharge preparation.  The pharmacist that spoke with me during my hospital stay was:  Treavor Blomquist A, [place]? Xarelto was prescribed to treat blood clots that may have been found in the veins of your legs (deep vein thrombosis) or in your lungs (pulmonary embolism) and to reduce the risk of them occurring again.  What do you need to know about Xarelto? The starting dose is one 15 mg tablet taken TWICE daily with food for the FIRST 21 DAYS then on (enter date)  04/10/2017  the dose is changed to one 20 mg tablet taken ONCE A DAY with your evening meal.  DO NOT stop taking Xarelto without talking to the health care provider who prescribed the medication.  Refill your prescription for 20 mg tablets before you run out.  After discharge, you should have regular check-up appointments with your healthcare provider that is prescribing your Xarelto.  In the future your dose may need to be changed if your kidney function changes by a significant amount.  What do you do if you miss a dose? If you are taking Xarelto TWICE DAILY and you miss a dose, take it as soon as you remember. You may take two 15 mg tablets (total 30 mg) at the same time then resume your regularly scheduled 15 mg twice daily the next day.  If you are taking Xarelto ONCE DAILY and you miss a dose, take it as soon as you remember on the same day then continue your regularly scheduled once daily regimen the next day. Do not take two doses of Xarelto at the same time.   Important Safety Information Xarelto is a blood thinner medicine that can cause bleeding. You should call your healthcare provider right away if you experience any of the following: ? Bleeding from an injury or your nose that does not stop. ? Unusual colored urine (red or dark brown) or  unusual colored stools (red or black). ? Unusual bruising for unknown reasons. ? A serious fall or if you hit your head (even if there is no bleeding).  Some medicines may interact with Xarelto and might increase your risk of bleeding while on Xarelto. To help avoid this, consult your healthcare provider or pharmacist prior to using any new prescription or non-prescription medications, including herbals, vitamins, non-steroidal anti-inflammatory drugs (NSAIDs) and supplements.  This website has more information on Xarelto: [email or web address]

## 2017-03-18 NOTE — ED Triage Notes (Signed)
Patient reports that he had fatigue and cold symptoms for couple days, then today while out walking he felt very SOB and neighbor drove patient back home. patient reports still feels intermittently SOB even when sitting still

## 2017-03-18 NOTE — ED Notes (Signed)
ED TO INPATIENT HANDOFF REPORT  Name/Age/Gender James Hughes 66 y.o. male  Code Status Code Status History    This patient does not have a recorded code status. Please follow your organizational policy for patients in this situation.      Home/SNF/Other Home  Chief Complaint shorntess of breath   Level of Care/Admitting Diagnosis ED Disposition    ED Disposition Condition [facility] Area: [facility] [[id number]]  Level of Care: Stepdown [14]  Admit to SDU based on following criteria: Hemodynamic compromise or significant risk of instability:  Patient requiring short term acute titration and management of vasoactive drips, and invasive monitoring (i.e., CVP and Arterial line).  Diagnosis: Pulmonary embolism [facility]) [[id number]]  Admitting Physician: Elodia Florence [phone]  Attending Physician: Cephus Slater, A CALDWELL [phone]  Estimated length of stay: past midnight tomorrow  Certification:: I certify this patient will need inpatient services for at least 2 midnights  PT Class (Do Not Modify): Inpatient [101]  PT Acc Code (Do Not Modify): Private [1]       Medical History History reviewed. No pertinent past medical history.  Allergies Allergies  Allergen Reactions  . Penicillins Hives    Has patient had a PCN reaction causing immediate rash, facial/tongue/throat swelling, SOB or lightheadedness with hypotension: No Has patient had a PCN reaction causing severe rash involving mucus membranes or skin necrosis: No Has patient had a PCN reaction that required hospitalization: No Has patient had a PCN reaction occurring within the last 10 years: No If all of the above answers are "NO", then may proceed with Cephalosporin use.   . Sulfa Antibiotics Hives    IV Location/Drains/Wounds Patient Lines/Drains/Airways Status   Active Line/Drains/Airways    Name:   Placement date:   Placement time:   Site:   Days:   Peripheral IV 03/18/17  Right Antecubital  03/18/17    1122    Antecubital    less than 1          Labs/Imaging Results for orders placed or performed during the hospital encounter of 03/18/17 (from the past 48 hour(s))  CBC with Differential     Status: None   Collection Time: 03/18/17 11:23 AM  Result Value Ref Range   WBC 9.2 4.0 - 10.5 K/uL   RBC 4.95 4.22 - 5.81 MIL/uL   Hemoglobin 15.7 13.0 - 17.0 g/dL   HCT 44.7 39.0 - 52.0 %   MCV 90.3 78.0 - 100.0 fL   MCH 31.7 26.0 - 34.0 pg   MCHC 35.1 30.0 - 36.0 g/dL   RDW 12.2 11.5 - 15.5 %   Platelets 159 150 - 400 K/uL   Neutrophils Relative % 81 %   Neutro Abs 7.4 1.7 - 7.7 K/uL   Lymphocytes Relative 11 %   Lymphs Abs 1.0 0.7 - 4.0 K/uL   Monocytes Relative 8 %   Monocytes Absolute 0.8 0.1 - 1.0 K/uL   Eosinophils Relative 0 %   Eosinophils Absolute 0.0 0.0 - 0.7 K/uL   Basophils Relative 0 %   Basophils Absolute 0.0 0.0 - 0.1 K/uL  Troponin I     Status: Abnormal   Collection Time: 03/18/17 11:23 AM  Result Value Ref Range   Troponin I 0.13 (HH) <0.03 ng/mL    Comment: CRITICAL RESULT CALLED TO, READ BACK BY AND VERIFIED WITH: THORNTON,S AT 12:55PM ON 03/18/17 BY [facility]   Basic metabolic panel     Status: Abnormal  Collection Time: 03/18/17 11:23 AM  Result Value Ref Range   Sodium 138 135 - 145 mmol/L   Potassium 3.7 3.5 - 5.1 mmol/L   Chloride 104 101 - 111 mmol/L   CO2 23 22 - 32 mmol/L   Glucose, Bld 122 (H) 65 - 99 mg/dL   BUN 13 6 - 20 mg/dL   Creatinine, Ser 0.95 0.61 - 1.24 mg/dL   Calcium 9.1 8.9 - 10.3 mg/dL   GFR calc non Af Amer >60 >60 mL/min   GFR calc Af Amer >60 >60 mL/min    Comment: (NOTE) The eGFR has been calculated using the CKD EPI equation. This calculation has not been validated in all clinical situations. eGFR's persistently <60 mL/min signify possible Chronic Kidney Disease.    Anion gap 11 5 - 15   Ct Angio Chest Pe W And/or Wo Contrast  Result Date: 03/18/2017 CLINICAL DATA:  Shortness of  breath, fatigue, cold symptoms, PE suspected EXAM: CT ANGIOGRAPHY CHEST WITH CONTRAST TECHNIQUE: Multidetector CT imaging of the chest was performed using the standard protocol during bolus administration of intravenous contrast. Multiplanar CT image reconstructions and MIPs were obtained to evaluate the vascular anatomy. CONTRAST:  100 mL Isovue 370 IV COMPARISON:  None. FINDINGS: Cardiovascular: Satisfactory opacification the bilateral pulmonary artery to the lobar level. Lobar and segmental pulmonary emboli on the left, including at the origin of the left pulmonary artery (series 6/ image 120) and at the bifurcation of the lingular and left lower lobe pulmonary arteries (series 6/image 128). Lobar and segmental pulmonary emboli on the right, including at the origin of the superior segment right lower lobe pulmonary artery (series 6/ image 125), at the bifurcation of the right lower lobe pulmonary artery (series 6/ image 141), and proximally in the right middle lobe pulmonary artery (series 6/ image 148). Overall clot burden is moderate. No evidence of right heart strain. RV to LV ratio equals 0.83. The heart is normal in size.  No pericardial effusion. No evidence of thoracic aortic aneurysm or dissection. Mediastinum/Nodes: No suspicious mediastinal lymphadenopathy. Visualized thyroid is unremarkable. Lungs/Pleura: No suspicious pulmonary nodules. Very mild patchy opacity in the posterior right lower lobe (series 5/image 57), possibly atelectasis, although early pulmonary infarct is possible. No pleural effusion or pneumothorax. Upper Abdomen: Visualized upper abdomen is unremarkable. Musculoskeletal: Visualized osseous structures are within normal limits. Review of the MIP images confirms the above findings. IMPRESSION: Bilateral lobar and segmental pulmonary emboli, as described above. Overall clot burden is moderate. No evidence of right heart strain. Critical Value/emergent results were called by telephone  at the time of interpretation on 03/18/2017 at 1:13 pm to Dr. Virgel Manifold, who verbally acknowledged these results. Electronically Signed   By: Julian Hy M.D.   On: 03/18/2017 13:13    Pending Labs [employer]    Start     Ordered   03/18/17 2030  Heparin level (unfractionated)  Once-Timed,   R     03/18/17 1417   03/18/17 1800  Troponin I (q 6hr x 3)  Now then every 6 hours,   R     03/18/17 1416   03/18/17 1418  Brain natriuretic peptide  Add-on,   R     03/18/17 1417   03/18/17 1418  APTT  STAT,   R     03/18/17 1417   03/18/17 1418  Protime-INR  STAT,   R     03/18/17 1417   03/18/17 1324  Antithrombin III  (Hypercoagulable Panel, Comprehensive (  PNL))  Once,   R     03/18/17 1323   03/18/17 1324  Protein C activity  (Hypercoagulable Panel, Comprehensive (PNL))  Once,   R     03/18/17 1323   03/18/17 1324  Protein C, total  (Hypercoagulable Panel, Comprehensive (PNL))  Once,   R     03/18/17 1323   03/18/17 1324  Protein S activity  (Hypercoagulable Panel, Comprehensive (PNL))  Once,   R     03/18/17 1323   03/18/17 1324  Protein S, total  (Hypercoagulable Panel, Comprehensive (PNL))  Once,   R     03/18/17 1323   03/18/17 1324  Lupus anticoagulant panel  (Hypercoagulable Panel, Comprehensive (PNL))  Once,   R     03/18/17 1323   03/18/17 1324  Beta-2-glycoprotein i abs, IgG/M/A  (Hypercoagulable Panel, Comprehensive (PNL))  Once,   R     03/18/17 1323   03/18/17 1324  Homocysteine, serum  (Hypercoagulable Panel, Comprehensive (PNL))  Once,   R     03/18/17 1323   03/18/17 1324  Factor 5 leiden  (Hypercoagulable Panel, Comprehensive (PNL))  Once,   R     03/18/17 1323   03/18/17 1324  Prothrombin gene mutation  (Hypercoagulable Panel, Comprehensive (PNL))  Once,   R     03/18/17 1323   03/18/17 1324  Cardiolipin antibodies, IgG, IgM, IgA  (Hypercoagulable Panel, Comprehensive (PNL))  Once,   R     03/18/17 1323   Signed and Held  Basic metabolic panel  Tomorrow  morning,   R     Signed and Held   Signed and Held  CBC  Tomorrow morning,   R     Signed and Held      Vitals/Pain Today's Vitals   03/18/17 0923 03/18/17 0926 03/18/17 1026 03/18/17 1412  BP:  (!) 171/101 (!) 156/93 (!) 155/92  Pulse:  (!) 126 (!) 107 (!) 108  Resp:  19 20 (!) 23  Temp:  (!) 97.5 F (36.4 C) 97.8 F (36.6 C)   TempSrc:  Oral Oral   SpO2:  95% 95% 95%  Weight: 160 lb (72.6 kg)     Height: '5\' 10"'$  (1.778 m)       Isolation Precautions No active isolations  Medications Medications  0.9 %  sodium chloride infusion ( Intravenous Stopped 03/18/17 1520)  heparin bolus via infusion 3,000 Units (not administered)  heparin ADULT infusion 100 units/mL (25000 units/283m sodium chloride 0.45%) (not administered)  iopamidol (ISOVUE-370) 76 % injection (100 mLs Intravenous Contrast Given 03/18/17 1257)    Mobility walks

## 2017-03-18 NOTE — H&P (Addendum)
History and Physical    James Hughes ZTI:[id number] DOB: December 22, 1950 DOA: 03/18/2017  PCP: Elby Showers, MD Patient coming from: home  I have personally briefly reviewed patient's old medical records in [place]  Chief Complaint: DOE  HPI: James Hughes is a 66 y.o. male without significant PMH presenting with progressive DOE.   Patient presents with worsening shortness of breath recent. He notes he had a cold a few weeks ago has noticed shortness of breath with exertion over the past few weeks. This morning when he is walking he had difficulty walking after a few blocks and felt faint and short of breath. For that reason he called his primary care doctor who directed him to the emergency department. He denies any chest pain, fevers, chills, hemoptysis.  Had a PE in the emergency department.  His father had a pulmonary embolism at 80 years old, but this was related to bedrest. Mother known history of venous thumb embolism and family. He has been traveling back and forth to [place] within the past few weeks (3 round trip trips with the past 3 weeks).  No history of recent cancer or surgery. His active walking 4-5 miles a day.   ED Course: EKG, labs, CTA.  Review of Systems: As per HPI otherwise 10 point review of systems negative.   History reviewed. No pertinent past medical history.  History reviewed. No pertinent surgical history.   reports that he has never smoked. He has never used smokeless tobacco. He reports that he drinks about 1.8 oz of alcohol per week . He reports that he does not use drugs.  Allergies  Allergen Reactions  . Penicillins Hives    Has patient had a PCN reaction causing immediate rash, facial/tongue/throat swelling, SOB or lightheadedness with hypotension: No Has patient had a PCN reaction causing severe rash involving mucus membranes or skin necrosis: No Has patient had a PCN reaction that required hospitalization: No Has patient had a  PCN reaction occurring within the last 10 years: No If all of the above answers are "NO", then may proceed with Cephalosporin use.   . Sulfa Antibiotics Hives    Family History  Problem Relation Age of Onset  . Cancer Mother   . Pulmonary embolism Father    Prior to Admission medications   Medication Sig Start Date End Date Taking? Authorizing Provider  acetaminophen (TYLENOL) 500 MG tablet Take 1,000 mg by mouth every 6 (six) hours as needed for mild pain.   Yes [provider]  cetirizine (ZYRTEC) 10 MG tablet Take 10 mg by mouth daily as needed for allergies.   Yes [provider]    Physical Exam: Vitals:   03/18/17 1549 03/18/17 1600 03/18/17 1625 03/18/17 1800  BP: (!) 172/96  (!) 171/84 (!) 172/87  Pulse: (!) 101  98 (!) 102  Resp: (!) 24  (!) 23 (!) 24  Temp: 97.8 F (36.6 C) 98.6 F (37 C)    TempSrc: Oral Oral    SpO2: 95%  92% 93%  Weight: 73.6 kg (162 lb 4.1 oz)     Height: 5\' 10"  (1.778 m)       Constitutional: NAD, calm, comfortable Vitals:   03/18/17 1549 03/18/17 1600 03/18/17 1625 03/18/17 1800  BP: (!) 172/96  (!) 171/84 (!) 172/87  Pulse: (!) 101  98 (!) 102  Resp: (!) 24  (!) 23 (!) 24  Temp: 97.8 F (36.6 C) 98.6 F (37 C)  TempSrc: Oral Oral    SpO2: 95%  92% 93%  Weight: 73.6 kg (162 lb 4.1 oz)     Height: 5\' 10"  (1.778 m)      Eyes: PERRL, lids and conjunctivae normal ENMT: Mucous membranes are moist. Posterior pharynx clear of any exudate or lesions.Normal dentition.  Neck: normal, supple, no masses, no thyromegaly Respiratory: clear to auscultation bilaterally, no wheezing, no crackles. Normal respiratory effort. No accessory muscle use.  Cardiovascular: Regular rate and rhythm, no murmurs / rubs / gallops. No extremity edema. 2+ pedal pulses. No carotid bruits.  Abdomen: no tenderness, no masses palpated. No hepatosplenomegaly. Bowel sounds positive.  Musculoskeletal: no clubbing / cyanosis. No joint deformity upper  and lower extremities. Good ROM, no contractures. Normal muscle tone.  Skin: no rashes, lesions, ulcers. No induration Neurologic: CN 2-12 grossly intact. Sensation intact. Strength 5/5 in all 4.  Psychiatric: Normal judgment and insight. Alert and oriented x 3. Normal mood.   Labs on Admission: I have personally reviewed following labs and imaging studies  CBC:  Recent Labs Lab 03/18/17 1123  WBC 9.2  NEUTROABS 7.4  HGB 15.7  HCT 44.7  MCV 90.3  PLT 716   Basic Metabolic Panel:  Recent Labs Lab 03/18/17 1123  NA 138  K 3.7  CL 104  CO2 23  GLUCOSE 122*  BUN 13  CREATININE 0.95  CALCIUM 9.1   GFR: Estimated Creatinine Clearance: 76.8 mL/min (by C-G formula based on SCr of 0.95 mg/dL). Liver Function Tests: No results for input(s): AST, ALT, ALKPHOS, BILITOT, PROT, ALBUMIN in the last 168 hours. No results for input(s): LIPASE, AMYLASE in the last 168 hours. No results for input(s): AMMONIA in the last 168 hours. Coagulation Profile:  Recent Labs Lab 03/18/17 1603  INR 1.25   Cardiac Enzymes:  Recent Labs Lab 03/18/17 1123 03/18/17 1740  TROPONINI 0.13* 0.22*   BNP (last 3 results) No results for input(s): PROBNP in the last 8760 hours. HbA1C: No results for input(s): HGBA1C in the last 72 hours. CBG: No results for input(s): GLUCAP in the last 168 hours. Lipid Profile: No results for input(s): CHOL, HDL, LDLCALC, TRIG, CHOLHDL, LDLDIRECT in the last 72 hours. Thyroid Function Tests: No results for input(s): TSH, T4TOTAL, FREET4, T3FREE, THYROIDAB in the last 72 hours. Anemia Panel: No results for input(s): VITAMINB12, FOLATE, FERRITIN, TIBC, IRON, RETICCTPCT in the last 72 hours. Urine analysis:    Component Value Date/Time   BILIRUBINUR neg 06/24/2014 1512   PROTEINUR neg 06/24/2014 1512   UROBILINOGEN negative 06/24/2014 1512   NITRITE neg 06/24/2014 1512   LEUKOCYTESUR Negative 06/24/2014 1512    Radiological Exams on Admission: Ct Angio  Chest Pe W And/or Wo Contrast  Result Date: 03/18/2017 CLINICAL DATA:  Shortness of breath, fatigue, cold symptoms, PE suspected EXAM: CT ANGIOGRAPHY CHEST WITH CONTRAST TECHNIQUE: Multidetector CT imaging of the chest was performed using the standard protocol during bolus administration of intravenous contrast. Multiplanar CT image reconstructions and MIPs were obtained to evaluate the vascular anatomy. CONTRAST:  100 mL Isovue 370 IV COMPARISON:  None. FINDINGS: Cardiovascular: Satisfactory opacification the bilateral pulmonary artery to the lobar level. Lobar and segmental pulmonary emboli on the left, including at the origin of the left pulmonary artery (series 6/ image 120) and at the bifurcation of the lingular and left lower lobe pulmonary arteries (series 6/image 128). Lobar and segmental pulmonary emboli on the right, including at the origin of the superior segment right lower lobe pulmonary artery (series 6/  image 125), at the bifurcation of the right lower lobe pulmonary artery (series 6/ image 141), and proximally in the right middle lobe pulmonary artery (series 6/ image 148). Overall clot burden is moderate. No evidence of right heart strain. RV to LV ratio equals 0.83. The heart is normal in size.  No pericardial effusion. No evidence of thoracic aortic aneurysm or dissection. Mediastinum/Nodes: No suspicious mediastinal lymphadenopathy. Visualized thyroid is unremarkable. Lungs/Pleura: No suspicious pulmonary nodules. Very mild patchy opacity in the posterior right lower lobe (series 5/image 57), possibly atelectasis, although early pulmonary infarct is possible. No pleural effusion or pneumothorax. Upper Abdomen: Visualized upper abdomen is unremarkable. Musculoskeletal: Visualized osseous structures are within normal limits. Review of the MIP images confirms the above findings. IMPRESSION: Bilateral lobar and segmental pulmonary emboli, as described above. Overall clot burden is moderate. No  evidence of right heart strain. Critical Value/emergent results were called by telephone at the time of interpretation on 03/18/2017 at 1:13 pm to Dr. Virgel Manifold, who verbally acknowledged these results. Electronically Signed   By: Julian Hy M.D.   On: 03/18/2017 13:13    EKG: Independently reviewed. Notable for sinus tach.  Minimal diffuse ST depression, <1 mm.    Assessment/Plan Active Problems:   Pulmonary embolism (HCC)   Elevated troponin   White coat syndrome with hypertension   Pulmonary Embolism:  Will call unprovoked (only risk factor has been trips to [facility], but these have been short flights).  Consider lifelong anticoagulation given unprovoked. He's due for colon cancer screening.  Last year normal PSA.    On room air, but DOE and at rest now CTA with segmental and lobar PE bilaterally.  No evidence of right heart strain.   Elevated troponin.  Normal proBNP [ ]  EDP ordered hypercoag panel, f/u Echo, [place] bilateral LE Heparin gtt for now. Stepdown.  D/c on DOAC when stable pending work up.    Elevated troponin:  Troponin 0.13.  Likely 2/2 demand with PE above.  Will continue to follow troponins.  Initial EKG with diffuse minimal ST depression.  Repeat EKG looked improved (R less prominent in III, but still present).  [ ]  continue to trend troponins Heparin for PE as above  White Coat Hypertension:  Notes home BP's normal in 120's, continue to monitor.  White coat HTN with office BP's in 150s. F/u outpatient.   DVT prophylaxis: heparin for DVT Code Status: full  Family Communication: wife present Disposition Plan: stepdown, home after workup  Consults called: none  Admission status: stepdown   Fayrene Helper MD [facility] [phone]   If 7PM-7AM, please contact night-coverage [email or web address] Password [email or web address]  03/18/2017, 7:07 PM

## 2017-03-19 ENCOUNTER — Inpatient Hospital Stay (HOSPITAL_COMMUNITY): Payer: Medicare Other

## 2017-03-19 DIAGNOSIS — I10 Essential (primary) hypertension: Secondary | ICD-10-CM

## 2017-03-19 LAB — ECHOCARDIOGRAM COMPLETE
Ao-asc: 31 cm
CHL CUP DOP CALC LVOT VTI: 16.5 cm
CHL CUP MV DEC (S): 217
CHL CUP RV SYS PRESS: 30 mmHg
CHL CUP TV REG PEAK VELOCITY: 258 cm/s
E/e' ratio: 6.35
EWDT: 217 ms
FS: 36 % (ref 28–44)
HEIGHTINCHES: 70 in
IV/PV OW: 0.86
LA diam index: 1.41 cm/m2
LA vol A4C: 43.5 ml
LA vol index: 24.2 mL/m2
LA vol: 46.3 mL
LASIZE: 27 mm
LDCA: 3.8 cm2
LEFT ATRIUM END SYS DIAM: 27 mm
LV E/e' medial: 6.35
LV PW d: 11.4 mm — AB (ref 0.6–1.1)
LV TDI E'LATERAL: 9.46
LV TDI E'MEDIAL: 6.15
LV e' LATERAL: 9.46 cm/s
LVEEAVG: 6.35
LVOTD: 22 mm
LVOTPV: 97.5 cm/s
LVOTSV: 63 mL
MV pk A vel: 76.2 m/s
MV pk E vel: 60.1 m/s
RV LATERAL S' VELOCITY: 17.4 cm/s
TAPSE: 24.8 mm
TRMAXVEL: 258 cm/s
Weight: 2596.14 oz

## 2017-03-19 LAB — CBC
HEMATOCRIT: 38.4 % — AB (ref 39.0–52.0)
HEMOGLOBIN: 13.6 g/dL (ref 13.0–17.0)
MCH: 31.6 pg (ref 26.0–34.0)
MCHC: 35.4 g/dL (ref 30.0–36.0)
MCV: 89.3 fL (ref 78.0–100.0)
Platelets: 135 10*3/uL — ABNORMAL LOW (ref 150–400)
RBC: 4.3 MIL/uL (ref 4.22–5.81)
RDW: 12.1 % (ref 11.5–15.5)
WBC: 7.2 10*3/uL (ref 4.0–10.5)

## 2017-03-19 LAB — HEPARIN LEVEL (UNFRACTIONATED): HEPARIN UNFRACTIONATED: 0.58 [IU]/mL (ref 0.30–0.70)

## 2017-03-19 LAB — BASIC METABOLIC PANEL
ANION GAP: 8 (ref 5–15)
BUN: 8 mg/dL (ref 6–20)
CALCIUM: 8.4 mg/dL — AB (ref 8.9–10.3)
CO2: 24 mmol/L (ref 22–32)
Chloride: 107 mmol/L (ref 101–111)
Creatinine, Ser: 0.85 mg/dL (ref 0.61–1.24)
GFR calc non Af Amer: >60 mL/min (ref >60)
GLUCOSE: 108 mg/dL — AB (ref 65–99)
POTASSIUM: 4.1 mmol/L (ref 3.5–5.1)
Sodium: 139 mmol/L (ref 135–145)

## 2017-03-19 LAB — TROPONIN I
TROPONIN I: 0.11 ng/mL — AB (ref <0.03)
TROPONIN I: 0.07 ng/mL — AB (ref <0.03)

## 2017-03-19 LAB — ANTITHROMBIN III: AntiThromb III Func: 96 % (ref 75–120)

## 2017-03-19 NOTE — Progress Notes (Signed)
ANTICOAGULATION CONSULT NOTE - Follow Up Consult  Pharmacy Consult for Heparin Indication: pulmonary embolus  Allergies  Allergen Reactions  . Penicillins Hives    Has patient had a PCN reaction causing immediate rash, facial/tongue/throat swelling, SOB or lightheadedness with hypotension: No Has patient had a PCN reaction causing severe rash involving mucus membranes or skin necrosis: No Has patient had a PCN reaction that required hospitalization: No Has patient had a PCN reaction occurring within the last 10 years: No If all of the above answers are "NO", then may proceed with Cephalosporin use.   . Sulfa Antibiotics Hives    Patient Measurements: Height: 5\' 10"  (177.8 cm) Weight: 162 lb 4.1 oz (73.6 kg) IBW/kg (Calculated) : 73 Heparin Dosing Weight:   Vital Signs: Temp: 98.5 F (36.9 C) (10/14 0401) Temp Source: Oral (10/14 0401) BP: 143/84 (10/14 0400) Pulse Rate: 71 (10/14 0400)  Labs:  Recent Labs  03/18/17 1123 03/18/17 1603 03/18/17 1740 03/18/17 2228 03/19/17 0002  HGB 15.7  --   --   --   --   HCT 44.7  --   --   --   --   PLT 159  --   --   --   --   APTT  --  154*  --   --   --   LABPROT  --  15.6*  --   --   --   INR  --  1.25  --   --   --   HEPARINUNFRC  --   --   --  0.57  --   CREATININE 0.95  --   --   --   --   TROPONINI 0.13*  --  0.22*  --  0.11*    Estimated Creatinine Clearance: 76.8 mL/min (by C-G formula based on SCr of 0.95 mg/dL).   Medications:  Infusions:  . sodium chloride 125 mL/hr at 03/19/17 0400  . heparin 1,200 Units/hr (03/19/17 0400)    Assessment: Patient with heparin level at goal.  No heparin issues noted.  Goal of Therapy:  Heparin level 0.3-0.7 units/ml Monitor platelets by anticoagulation protocol: Yes   Plan:  Continue heparin drip at current rate Recheck level at 0800  Tyler Deis, [place] 03/19/2017,4:28 AM

## 2017-03-19 NOTE — Progress Notes (Signed)
  Echocardiogram 2D Echocardiogram has been performed.  James Hughes 03/19/2017, 8:56 AM

## 2017-03-19 NOTE — Progress Notes (Signed)
Patient ID: James Hughes, male   DOB: 1951/05/08, 66 y.o.   MRN: [id number]  PROGRESS NOTE    James Hughes  MVE:[id number] DOB: 1951/04/28 DOA: 03/18/2017  PCP: Elby Showers, MD   Brief Narrative:  66 year old male with no significant past medical history. Patient presented with progressively worsening shortness of breath. He has had a cold few weeks prior to the admission and had shortness of breath ever since then but this has progressively worsened in past couple of days. Shortness of breath is present at rest and with exertion. No reports of chest pain or fevers. Patient was found to have pulmonary embolism on admission. He was started on heparin drip   Assessment & Plan:   Active Problems:   Pulmonary embolism (HCC) - Ct Angio Chest Pe W And/or Wo Contrast showed bilateral lobar and segmental pulmonary emboli, No evidence of right heart strain - On heparin drip    Elevated troponin - Likely demand ischemia from pulmonary embolism - Troponin level trended down - Awaiting echo results    DVT prophylaxis: Heparin drip Code Status: full code  Family Communication: no family at the bedside  Disposition Plan: transfer to telemetry today, home in am   Consultants:   None  Procedures:   ECHO - pending   Antimicrobials:   None    Subjective: No overnight events.  Objective: Vitals:   03/19/17 0400 03/19/17 0401 03/19/17 0600 03/19/17 0800  BP: (!) 143/84  (!) 153/80   Pulse: 71  78   Resp: (!) 22  (!) 21   Temp: 98.5 F (36.9 C) 98.5 F (36.9 C)  98 F (36.7 C)  TempSrc: Oral Oral  Oral  SpO2: 94%  95%   Weight:      Height:        Intake/Output Summary (Last 24 hours) at 03/19/17 0906 Last data filed at 03/19/17 0457  Gross per 24 hour  Intake           2883.4 ml  Output             1475 ml  Net           1408.4 ml   Filed Weights   03/18/17 0923 03/18/17 1549  Weight: 72.6 kg (160 lb) 73.6 kg (162 lb 4.1 oz)    Examination:  General  exam: Appears calm and comfortable  Respiratory system: Clear to auscultation. Respiratory effort normal. Cardiovascular system: S1 & S2 heard, RRR.  Gastrointestinal system: Abdomen is nondistended, soft and nontender. No organomegaly or masses felt. Normal bowel sounds heard. Central nervous system: Alert and oriented. No focal neurological deficits. Extremities: Symmetric 5 x 5 power. Skin: No rashes, lesions or ulcers Psychiatry: Judgement and insight appear normal. Mood & affect appropriate.   Data Reviewed: I have personally reviewed following labs and imaging studies  CBC:  Recent Labs Lab 03/18/17 1123 03/19/17 0643  WBC 9.2 7.2  NEUTROABS 7.4  --   HGB 15.7 13.6  HCT 44.7 38.4*  MCV 90.3 89.3  PLT 159 283*   Basic Metabolic Panel:  Recent Labs Lab 03/18/17 1123 03/19/17 0643  NA 138 139  K 3.7 4.1  CL 104 107  CO2 23 24  GLUCOSE 122* 108*  BUN 13 8  CREATININE 0.95 0.85  CALCIUM 9.1 8.4*   GFR: Estimated Creatinine Clearance: 85.9 mL/min (by C-G formula based on SCr of 0.85 mg/dL). Liver Function Tests: No results for input(s): AST, ALT, ALKPHOS, BILITOT, PROT, ALBUMIN in  the last 168 hours. No results for input(s): LIPASE, AMYLASE in the last 168 hours. No results for input(s): AMMONIA in the last 168 hours. Coagulation Profile:  Recent Labs Lab 03/18/17 1603  INR 1.25   Cardiac Enzymes:  Recent Labs Lab 03/18/17 1123 03/18/17 1740 03/19/17 0002 03/19/17 0643  TROPONINI 0.13* 0.22* 0.11* 0.07*   BNP (last 3 results) No results for input(s): PROBNP in the last 8760 hours. HbA1C: No results for input(s): HGBA1C in the last 72 hours. CBG: No results for input(s): GLUCAP in the last 168 hours. Lipid Profile: No results for input(s): CHOL, HDL, LDLCALC, TRIG, CHOLHDL, LDLDIRECT in the last 72 hours. Thyroid Function Tests: No results for input(s): TSH, T4TOTAL, FREET4, T3FREE, THYROIDAB in the last 72 hours. Anemia Panel: No results for  input(s): VITAMINB12, FOLATE, FERRITIN, TIBC, IRON, RETICCTPCT in the last 72 hours. Urine analysis:    Component Value Date/Time   BILIRUBINUR neg 06/24/2014 1512   PROTEINUR neg 06/24/2014 1512   UROBILINOGEN negative 06/24/2014 1512   NITRITE neg 06/24/2014 1512   LEUKOCYTESUR Negative 06/24/2014 1512   Sepsis Labs: @LABRCNTIP (procalcitonin:4,lacticidven:4)   ) MRSA PCR Screening     Status: None   Collection Time: 03/18/17  4:08 PM  Result Value Ref Range Status   MRSA by PCR NEGATIVE NEGATIVE Final      Radiology Studies: Ct Angio Chest Pe W And/or Wo Contrast Result Date: 03/18/2017 Bilateral lobar and segmental pulmonary emboli, as described above. Overall clot burden is moderate. No evidence of right heart strain.    Scheduled Meds: Continuous Infusions: . sodium chloride 125 mL/hr at 03/19/17 0753  . heparin 1,200 Units/hr (03/19/17 0753)     LOS: 1 day    Time spent: 25 minutes  Greater than 50% of the time spent on counseling and coordinating the care.   Leisa Lenz, MD [facility] Pager [phone]  If 7PM-7AM, please contact night-coverage [email or web address] Password [email or web address] 03/19/2017, 9:06 AM

## 2017-03-19 NOTE — Progress Notes (Signed)
[place] for Xarelto (changed to Heparin infusion per pharmacy) Indication: pulmonary embolus  Allergies  Allergen Reactions  . Penicillins Hives    Has patient had a PCN reaction causing immediate rash, facial/tongue/throat swelling, SOB or lightheadedness with hypotension: No Has patient had a PCN reaction causing severe rash involving mucus membranes or skin necrosis: No Has patient had a PCN reaction that required hospitalization: No Has patient had a PCN reaction occurring within the last 10 years: No If all of the above answers are "NO", then may proceed with Cephalosporin use.   . Sulfa Antibiotics Hives    Patient Measurements: Height: 5\' 10"  (177.8 cm) Weight: 162 lb 4.1 oz (73.6 kg) IBW/kg (Calculated) : 73  Vital Signs: Temp: 98 F (36.7 C) (10/14 0800) Temp Source: Oral (10/14 0800) BP: 153/80 (10/14 0600) Pulse Rate: 78 (10/14 0600)  Labs:  Recent Labs  03/18/17 1123 03/18/17 1603 03/18/17 1740 03/18/17 2228 03/19/17 0002 03/19/17 0643  HGB 15.7  --   --   --   --  13.6  HCT 44.7  --   --   --   --  38.4*  PLT 159  --   --   --   --  135*  APTT  --  154*  --   --   --   --   LABPROT  --  15.6*  --   --   --   --   INR  --  1.25  --   --   --   --   HEPARINUNFRC  --   --   --  0.57  --  0.58  CREATININE 0.95  --   --   --   --  0.85  TROPONINI 0.13*  --  0.22*  --  0.11* 0.07*    Estimated Creatinine Clearance: 85.9 mL/min (by C-G formula based on SCr of 0.85 mg/dL).   Medical History: History reviewed. No pertinent past medical history.   Assessment: 79 yoM presents with shortness of breath. CTA chest shows bilateral lobar and segmental pulmonary emboli. Overall clot burden is moderate. No evidence of right heart strain. Pharmacy consulted to dose Xarelto.  Not on any anticoagulants PTA. SCr and CBC WNL at baseline. Plans change from Xarelto to heparin gtt prior to any Xarelto being given.    Today,  03/19/2017  Confirmatory heparin level remains therapeutic at 0.58 on heparin 1200 units/hr  CBC: Hgb WNL, platelets have mildly decreased to 135  Troponin improving  ECHO Performed this am  No obvious bleeding issues  Goal of Therapy:  Heparin level 0.3-0.7 units/ml Monitor platelets by anticoagulation protocol: Yes   Plan:  Continue heparin infusion infusion at 1200 units/hr.  Daily heparin level and CBC while on heparin infusion.    Watch pltc  Await plans for transition to oral anticoagulation  Doreene Eland, PharmD, BCPS.   Pager: [phone] 03/19/2017 9:38 AM

## 2017-03-20 ENCOUNTER — Inpatient Hospital Stay (HOSPITAL_COMMUNITY): Payer: Medicare Other

## 2017-03-20 ENCOUNTER — Telehealth: Payer: Self-pay | Admitting: Internal Medicine

## 2017-03-20 DIAGNOSIS — Z86711 Personal history of pulmonary embolism: Secondary | ICD-10-CM

## 2017-03-20 LAB — HOMOCYSTEINE: Homocysteine: 13.2 umol/L (ref 0.0–15.0)

## 2017-03-20 LAB — CARDIOLIPIN ANTIBODIES, IGG, IGM, IGA
Anticardiolipin IgA: <9 APL U/mL (ref 0–11)
Anticardiolipin IgG: <9 GPL U/mL (ref 0–14)
Anticardiolipin IgM: <9 MPL U/mL (ref 0–12)

## 2017-03-20 LAB — CBC
HEMATOCRIT: 37.9 % — AB (ref 39.0–52.0)
HEMOGLOBIN: 13.4 g/dL (ref 13.0–17.0)
MCH: 31.5 pg (ref 26.0–34.0)
MCHC: 35.4 g/dL (ref 30.0–36.0)
MCV: 89.2 fL (ref 78.0–100.0)
Platelets: 163 10*3/uL (ref 150–400)
RBC: 4.25 MIL/uL (ref 4.22–5.81)
RDW: 12.1 % (ref 11.5–15.5)
WBC: 8.6 10*3/uL (ref 4.0–10.5)

## 2017-03-20 LAB — BASIC METABOLIC PANEL
ANION GAP: 8 (ref 5–15)
BUN: 9 mg/dL (ref 6–20)
CALCIUM: 8.6 mg/dL — AB (ref 8.9–10.3)
CHLORIDE: 107 mmol/L (ref 101–111)
CO2: 23 mmol/L (ref 22–32)
Creatinine, Ser: 0.77 mg/dL (ref 0.61–1.24)
GFR calc non Af Amer: >60 mL/min (ref >60)
GLUCOSE: 106 mg/dL — AB (ref 65–99)
POTASSIUM: 3.8 mmol/L (ref 3.5–5.1)
Sodium: 138 mmol/L (ref 135–145)

## 2017-03-20 LAB — HEPARIN LEVEL (UNFRACTIONATED): HEPARIN UNFRACTIONATED: 0.56 [IU]/mL (ref 0.30–0.70)

## 2017-03-20 MED ORDER — RIVAROXABAN (XARELTO) VTE STARTER PACK (15 & 20 MG)
ORAL_TABLET | ORAL | 0 refills | Status: DC
Start: 2017-03-20 — End: 2017-04-07

## 2017-03-20 MED ORDER — RIVAROXABAN 15 MG PO TABS
15.0000 mg | ORAL_TABLET | Freq: Two times a day (BID) | ORAL | Status: DC
  Administered 2017-03-20: 15 mg via ORAL
  Filled 2017-03-20: qty 1

## 2017-03-20 NOTE — Telephone Encounter (Signed)
Patient calls stating he has been in the hospital over the weekend and should be discharged later this morning/early afternoon.  Wants to know if you want to see him r/t PE?  Feels that you may want to see him today or tomorrow even though he has a CPE scheduled on 11/2.    Please let me know.    Best number for contact:  [phone]  Thank you.

## 2017-03-20 NOTE — Discharge Summary (Signed)
Physician Discharge Summary  RITCHIE KLEE UXN:[id number] DOB: [birth year] DOA: 03/18/2017  PCP: Elby Showers, MD  Admit date: 03/18/2017 Discharge date: 03/20/2017  Recommendations for Outpatient Follow-up:  Continue xarelto 15 mg twice a day for 21 days and then 20 mg daily  Discharge Diagnoses:  Active Problems:   Pulmonary embolism (HCC)   Elevated troponin   White coat syndrome with hypertension    Discharge Condition: stable   Diet recommendation: as tolerated   History of present illness:  66 year old male with no significant past medical history. Patient presented with progressively worsening shortness of breath. He has had a cold few weeks prior to the admission and had shortness of breath ever since then but this has progressively worsened in past couple of days. Shortness of breath is present at rest and with exertion. No reports of chest pain or fevers. Patient was found to have pulmonary embolism on admission. He was started on heparin drip  Hospital Course:  Active Problems:   Pulmonary embolism (HCC) - CT angio chest showed bilateral pulmonary embolism - Change hep drip to xarelto as noted above    Elevated troponin - Likely demand ischemia from pulmonary embolism - Troponin level trended down - ECHO with normal EF, grade 1 DD    DVT prophylaxis: hep drip --> change to xarelto  Code Status: full code  Family Communication: wife at bedside    Consultants:   None  Procedures:   ECHO - normal EF, grade 1 DD  Antimicrobials:   None    Signed:  Leisa Lenz, MD  [facility] 03/20/2017, 11:36 AM  Pager #: [phone]  Time spent in minutes: more than 30 minutes    Discharge Exam: Vitals:   03/19/17 2151 03/20/17 0457  BP: (!) 146/83 (!) 145/78  Pulse: 80 86  Resp: 20 20  Temp: 99.1 F (37.3 C) 98.4 F (36.9 C)  SpO2: 97% 98%   Vitals:   03/19/17 1127 03/19/17 1452 03/19/17 2151 03/20/17 0457  BP: (!) 154/99  (!) 149/79 (!) 146/83 (!) 145/78  Pulse: 83 85 80 86  Resp: 20 (!) 22 20 20   Temp: 98 F (36.7 C) 99 F (37.2 C) 99.1 F (37.3 C) 98.4 F (36.9 C)  TempSrc: Oral Oral Oral Oral  SpO2: 97% 97% 97% 98%  Weight:      Height:        General: Pt is alert, follows commands appropriately, not in acute distress Cardiovascular: Regular rate and rhythm, S1/S2 +, no murmurs Respiratory: Clear to auscultation bilaterally, no wheezing, no crackles, no rhonchi Abdominal: Soft, non tender, non distended, bowel sounds +, no guarding Extremities: no edema, no cyanosis, pulses palpable bilaterally DP and PT Neuro: Grossly nonfocal  Discharge Instructions  Discharge Instructions    Call MD for:  difficulty breathing, headache or visual disturbances    Complete by:  As directed    Call MD for:  persistant dizziness or light-headedness    Complete by:  As directed    Call MD for:  persistant nausea and vomiting    Complete by:  As directed    Call MD for:  redness, tenderness, or signs of infection (pain, swelling, redness, odor or green/yellow discharge around incision site)    Complete by:  As directed    Call MD for:  severe uncontrolled pain    Complete by:  As directed    Diet - low sodium heart healthy    Complete by:  As directed  Discharge instructions    Complete by:  As directed    Continue xarelto 15 mg twice a day for 21 days and then 20 mg daily   Increase activity slowly    Complete by:  As directed      Allergies as of 03/20/2017      Reactions   Penicillins Hives   Has patient had a PCN reaction causing immediate rash, facial/tongue/throat swelling, SOB or lightheadedness with hypotension: No Has patient had a PCN reaction causing severe rash involving mucus membranes or skin necrosis: No Has patient had a PCN reaction that required hospitalization: No Has patient had a PCN reaction occurring within the last 10 years: No If all of the above answers are "NO", then may  proceed with Cephalosporin use.   Sulfa Antibiotics Hives      Medication List    TAKE these medications   acetaminophen 500 MG tablet Commonly known as:  TYLENOL Take 1,000 mg by mouth every 6 (six) hours as needed for mild pain.   cetirizine 10 MG tablet Commonly known as:  ZYRTEC Take 10 mg by mouth daily as needed for allergies.   Rivaroxaban 15 & 20 MG Tbpk Take as directed on package: Start with one 15mg  tablet by mouth twice a day with food. On Day 22, switch to one 20mg  tablet once a day with food.      Follow-up Information    Baxley, Cresenciano Lick, MD. Schedule an appointment as soon as possible for a visit.   Specialty:  Internal Medicine Contact information: [id number] [place] [id number] [phone]            The results of significant diagnostics from this hospitalization (including imaging, microbiology, ancillary and laboratory) are listed below for reference.    Significant Diagnostic Studies: Ct Angio Chest Pe W And/or Wo Contrast  Result Date: 03/18/2017 CLINICAL DATA:  Shortness of breath, fatigue, cold symptoms, PE suspected EXAM: CT ANGIOGRAPHY CHEST WITH CONTRAST TECHNIQUE: Multidetector CT imaging of the chest was performed using the standard protocol during bolus administration of intravenous contrast. Multiplanar CT image reconstructions and MIPs were obtained to evaluate the vascular anatomy. CONTRAST:  100 mL Isovue 370 IV COMPARISON:  None. FINDINGS: Cardiovascular: Satisfactory opacification the bilateral pulmonary artery to the lobar level. Lobar and segmental pulmonary emboli on the left, including at the origin of the left pulmonary artery (series 6/ image 120) and at the bifurcation of the lingular and left lower lobe pulmonary arteries (series 6/image 128). Lobar and segmental pulmonary emboli on the right, including at the origin of the superior segment right lower lobe pulmonary artery (series 6/ image 125), at the bifurcation of  the right lower lobe pulmonary artery (series 6/ image 141), and proximally in the right middle lobe pulmonary artery (series 6/ image 148). Overall clot burden is moderate. No evidence of right heart strain. RV to LV ratio equals 0.83. The heart is normal in size.  No pericardial effusion. No evidence of thoracic aortic aneurysm or dissection. Mediastinum/Nodes: No suspicious mediastinal lymphadenopathy. Visualized thyroid is unremarkable. Lungs/Pleura: No suspicious pulmonary nodules. Very mild patchy opacity in the posterior right lower lobe (series 5/image 57), possibly atelectasis, although early pulmonary infarct is possible. No pleural effusion or pneumothorax. Upper Abdomen: Visualized upper abdomen is unremarkable. Musculoskeletal: Visualized osseous structures are within normal limits. Review of the MIP images confirms the above findings. IMPRESSION: Bilateral lobar and segmental pulmonary emboli, as described above. Overall clot burden is moderate. No evidence of  right heart strain. Critical Value/emergent results were called by telephone at the time of interpretation on 03/18/2017 at 1:13 pm to Dr. Virgel Manifold, who verbally acknowledged these results. Electronically Signed   By: Julian Hy M.D.   On: 03/18/2017 13:13    Microbiology: Recent Results (from the past 240 hour(s))  MRSA PCR Screening     Status: None   Collection Time: 03/18/17  4:08 PM  Result Value Ref Range Status   MRSA by PCR NEGATIVE NEGATIVE Final    Comment:        The GeneXpert MRSA Assay (FDA approved for NASAL specimens only), is one component of a comprehensive MRSA colonization surveillance program. It is not intended to diagnose MRSA infection nor to guide or monitor treatment for MRSA infections.      Labs: Basic Metabolic Panel:  Recent Labs Lab 03/18/17 1123 03/19/17 0643 03/20/17 0457  NA 138 139 138  K 3.7 4.1 3.8  CL 104 107 107  CO2 23 24 23   GLUCOSE 122* 108* 106*  BUN 13 8 9    CREATININE 0.95 0.85 0.77  CALCIUM 9.1 8.4* 8.6*   Liver Function Tests: No results for input(s): AST, ALT, ALKPHOS, BILITOT, PROT, ALBUMIN in the last 168 hours. No results for input(s): LIPASE, AMYLASE in the last 168 hours. No results for input(s): AMMONIA in the last 168 hours. CBC:  Recent Labs Lab 03/18/17 1123 03/19/17 0643 03/20/17 0457  WBC 9.2 7.2 8.6  NEUTROABS 7.4  --   --   HGB 15.7 13.6 13.4  HCT 44.7 38.4* 37.9*  MCV 90.3 89.3 89.2  PLT 159 135* 163   Cardiac Enzymes:  Recent Labs Lab 03/18/17 1123 03/18/17 1740 03/19/17 0002 03/19/17 0643  TROPONINI 0.13* 0.22* 0.11* 0.07*   BNP: BNP (last 3 results)  Recent Labs  03/18/17 1123  BNP 41.3    ProBNP (last 3 results) No results for input(s): PROBNP in the last 8760 hours.  CBG: No results for input(s): GLUCAP in the last 168 hours.

## 2017-03-20 NOTE — Telephone Encounter (Signed)
Spoke with patient s/p discharge and provided appointment for Tuesday, 10/16 at 12:00 noon.  Patient has confirmed.

## 2017-03-20 NOTE — Progress Notes (Addendum)
Co-pay for Xarelto is $44.00 from Part D.

## 2017-03-20 NOTE — Progress Notes (Addendum)
[place] for Xarelto (changed to Heparin infusion per pharmacy) Indication: pulmonary embolus  Allergies  Allergen Reactions  . Penicillins Hives    Has patient had a PCN reaction causing immediate rash, facial/tongue/throat swelling, SOB or lightheadedness with hypotension: No Has patient had a PCN reaction causing severe rash involving mucus membranes or skin necrosis: No Has patient had a PCN reaction that required hospitalization: No Has patient had a PCN reaction occurring within the last 10 years: No If all of the above answers are "NO", then may proceed with Cephalosporin use.   . Sulfa Antibiotics Hives    Patient Measurements: Height: 5\' 10"  (177.8 cm) Weight: 162 lb 4.1 oz (73.6 kg) IBW/kg (Calculated) : 73  Vital Signs: Temp: 98.4 F (36.9 C) (10/15 0457) Temp Source: Oral (10/15 0457) BP: 145/78 (10/15 0457) Pulse Rate: 86 (10/15 0457)  Labs:  Recent Labs  03/18/17 1123 03/18/17 1603 03/18/17 1740 03/18/17 2228 03/19/17 0002 03/19/17 0643 03/20/17 0457  HGB 15.7  --   --   --   --  13.6 13.4  HCT 44.7  --   --   --   --  38.4* 37.9*  PLT 159  --   --   --   --  135* 163  APTT  --  154*  --   --   --   --   --   LABPROT  --  15.6*  --   --   --   --   --   INR  --  1.25  --   --   --   --   --   HEPARINUNFRC  --   --   --  0.57  --  0.58 0.56  CREATININE 0.95  --   --   --   --  0.85 0.77  TROPONINI 0.13*  --  0.22*  --  0.11* 0.07*  --     Estimated Creatinine Clearance: 91.3 mL/min (by C-G formula based on SCr of 0.77 mg/dL).    Assessment: 53 yoM presents with shortness of breath. CTA chest shows bilateral lobar and segmental pulmonary emboli. Overall clot burden is moderate. No evidence of right heart strain. Pharmacy consulted to dose Xarelto. Not on any anticoagulants PTA. SCr and CBC WNL at baseline. Plans change from Xarelto to heparin gtt prior to any Xarelto being given.    Today, 03/20/2017  Daily  heparin level remains therapeutic on heparin 1200 units/hr  CBC: Hgb, Pltc WNL  Troponin peaked at 0.22, now improved  No bleeding or line issues documented  Goal of Therapy:  Heparin level 0.3-0.7 units/ml Monitor platelets by anticoagulation protocol: Yes   Plan:  Continue heparin infusion infusion at 1200 units/hr.  Daily heparin level and CBC while on heparin infusion  Monitor for signs of bleeding or worsening thrombosis  Await plans for transition to oral anticoagulation  Reuel Boom, PharmD, BCPS Pager: [phone] 03/20/2017, 7:13 AM    ADDENDUM  OK for discharge on Xarelto today  Begin Xarelto 15 mg bid with meals x 21 days, followed by 20 mg daily with meals thereafter  Stop heparin with first dose of Xarelto  Education provided by Pharmacy, MD  Reuel Boom, PharmD, BCPS Pager: [phone] 03/20/2017, 10:37 AM

## 2017-03-20 NOTE — Telephone Encounter (Signed)
He's having a LE Doppler at [facility] at 10:15 this a.m.  Just an FYI.

## 2017-03-20 NOTE — Progress Notes (Signed)
VASCULAR LAB PRELIMINARY  PRELIMINARY  PRELIMINARY  PRELIMINARY  Bilateral lower extremity venous duplex completed.    Preliminary report:  Right : No evidence of DVT, superficial thrombosis or Baker's cyst. Left - Positive for and acute DVT noted in the posterior tibial vein coursing from approximately 2 inches above the ankle to mid calf. No evidence of superficial thrombosis or Baker's cyst.   Semaja Lymon, RVS 03/20/2017, 10:31 AM

## 2017-03-20 NOTE — Progress Notes (Signed)
Pt did not have part D card with him. Explained to wife to call [employer] to check on coverage. If there is no coverage, pt can call Xarelto/[facility] toll free number on card to check on extended programs. Mrs. Rylee understood.

## 2017-03-20 NOTE — Telephone Encounter (Signed)
I am keeping up with this by [employer]. He had an excellent MD, Dr. Fayrene Helper. He will be discharged on blood thinner. Can see him soon after discharge.

## 2017-03-21 ENCOUNTER — Encounter: Payer: Self-pay | Admitting: Internal Medicine

## 2017-03-21 ENCOUNTER — Ambulatory Visit (INDEPENDENT_AMBULATORY_CARE_PROVIDER_SITE_OTHER): Payer: Medicare Other | Admitting: Internal Medicine

## 2017-03-21 VITALS — BP 150/84 | HR 98 | Temp 97.9°F | Wt 167.0 lb

## 2017-03-21 DIAGNOSIS — Z09 Encounter for follow-up examination after completed treatment for conditions other than malignant neoplasm: Secondary | ICD-10-CM

## 2017-03-21 DIAGNOSIS — I2699 Other pulmonary embolism without acute cor pulmonale: Secondary | ICD-10-CM

## 2017-03-21 DIAGNOSIS — Z86718 Personal history of other venous thrombosis and embolism: Secondary | ICD-10-CM

## 2017-03-21 LAB — LUPUS ANTICOAGULANT PANEL
DRVVT: 40.2 s (ref 0.0–47.0)
PTT Lupus Anticoagulant: 32.3 s (ref 0.0–51.9)

## 2017-03-21 LAB — PROTEIN S ACTIVITY: Protein S Activity: 78 % (ref 63–140)

## 2017-03-21 LAB — PROTEIN C ACTIVITY: Protein C Activity: 67 % — ABNORMAL LOW (ref 73–180)

## 2017-03-21 LAB — PROTEIN S, TOTAL: Protein S Ag, Total: 86 % (ref 60–150)

## 2017-03-21 LAB — PROTEIN C, TOTAL: Protein C, Total: 59 % — ABNORMAL LOW (ref 60–150)

## 2017-03-21 NOTE — Progress Notes (Signed)
   Subjective:    Patient ID: James Hughes, male    DOB: 1951-03-23, 66 y.o.   MRN: [id number]  HPI 66 year old White Male hospitalized at [facility] on October 13 after experiencing extreme shortness of breath while walking with his wife.  Wife reported he had a near syncopal episode.  He had a respiratory infection a couple of weeks prior to admission.  He had also been traveling to [place] by airplane 3 times in the past few weeks.  He was seen in the emergency department and CT scan revealed bilateral lobar and segmental pulmonary emboli.  Overall clot burden was moderate.  Patient was admitted and evaluation was undertaken for hypercoagulable state.  Some of the results are still pending.  He was treated with IV heparin and placed on Xarelto.  Initial troponin was elevated at 0.22 and subsequently 0.11 but this was thought to be due to right heart strain.  He had a 2D echocardiogram showing normal LV function.  Grade 1 diastolic dysfunction was noted.  Trivial mitral regurgitation.  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  Right ventricle moderately dilated.  Pulmonary artery normal.  Right atrium normal.  Patient exercises regularly by walking or biking.  Family history remarkable for father who died of a PE at age 30  after significant trauma and months of bed rest.  No other family members with history of clotting disorder.  No history of serious illnesses accidents or operations prior to this admission.  Patient is now retired.  He is married.  No children.  Wife retired from [employer] and [employer] of [place] where she was Teacher, English as a foreign language.  He has no other plans to fly in the upcoming future except for possibly a trip to [place] around Thanksgiving.  Lower extremity Doppler in the hospital showed left DVT.  Hypercoagulable workup so far shows negative prothrombin gene mutation, negative factor V Leiden mutation, normal homocystine level.  Normal lupus  anticoagulant panel.  Protein C total slightly decreased at 59 normal being between 60 and 150%.  Protein C activity 67 normal being between 73 and 180% Antithrombin III normal.  Normal anterior cardiolipin antibodies.  BNP was 41.3 on admission    Review of Systems no new complaints.     Objective:   Physical Exam Neck is supple without JVD thyromegaly or carotid bruits.  Chest clear to auscultation without rales or wheezing.  Cardiac exam regular rate and rhythm normal S1 and S2 2.  Extremities without edema.       Assessment & Plan:  Bilateral pulmonary emboli with moderate burden  Left lower extremity DVT  ?  Protein C deficiency  Plan: Patient will remain on Xarelto.  My recommendation is 6 months but we are going to check with Dr.  Neysa Hotter regarding this and also for him to advise  patient regarding travel in the near future.  For now, would like patient to take it easy with some light walking about the neighborhood.  Counseled regarding direct 10  inhibitors and potential for bleeding.  He has appointment for physical exam here in the near future that was made prior to this event.

## 2017-03-22 LAB — BETA-2-GLYCOPROTEIN I ABS, IGG/M/A
Beta-2 Glyco I IgG: <9 GPI IgG units (ref 0–20)
Beta-2-Glycoprotein I IgA: <9 GPI IgA units (ref 0–25)
Beta-2-Glycoprotein I IgM: <9 GPI IgM units (ref 0–32)

## 2017-03-23 LAB — FACTOR 5 LEIDEN

## 2017-03-23 LAB — PROTHROMBIN GENE MUTATION

## 2017-03-26 NOTE — Patient Instructions (Addendum)
Light walking about the neighborhood.  Do not overexert yourself.  Continue Xarelto.  Keep appointment here November 2 or sooner if worse.  We will arrange for consultation with Dr. Beryle Beams.

## 2017-04-03 ENCOUNTER — Other Ambulatory Visit: Payer: Medicare Other | Admitting: Internal Medicine

## 2017-04-03 DIAGNOSIS — Z125 Encounter for screening for malignant neoplasm of prostate: Secondary | ICD-10-CM

## 2017-04-03 DIAGNOSIS — Z13220 Encounter for screening for lipoid disorders: Secondary | ICD-10-CM | POA: Diagnosis not present

## 2017-04-03 DIAGNOSIS — I10 Essential (primary) hypertension: Secondary | ICD-10-CM | POA: Diagnosis not present

## 2017-04-03 DIAGNOSIS — Z0000 Encounter for general adult medical examination without abnormal findings: Secondary | ICD-10-CM

## 2017-04-03 LAB — CBC WITH DIFFERENTIAL/PLATELET
BASOS ABS: 43 {cells}/uL (ref 0–200)
Basophils Relative: 0.6 %
EOS PCT: 0.7 %
Eosinophils Absolute: 50 cells/uL (ref 15–500)
HCT: 45.1 % (ref 38.5–50.0)
HEMOGLOBIN: 15.5 g/dL (ref 13.2–17.1)
Lymphs Abs: 1505 cells/uL (ref 850–3900)
MCH: 31.3 pg (ref 27.0–33.0)
MCHC: 34.4 g/dL (ref 32.0–36.0)
MCV: 90.9 fL (ref 80.0–100.0)
MONOS PCT: 7.4 %
MPV: 9.8 fL (ref 7.5–12.5)
NEUTROS ABS: 4977 {cells}/uL (ref 1500–7800)
Neutrophils Relative %: 70.1 %
Platelets: 215 10*3/uL (ref 140–400)
RBC: 4.96 10*6/uL (ref 4.20–5.80)
RDW: 11.8 % (ref 11.0–15.0)
Total Lymphocyte: 21.2 %
WBC mixed population: 525 cells/uL (ref 200–950)
WBC: 7.1 10*3/uL (ref 3.8–10.8)

## 2017-04-03 LAB — LIPID PANEL
CHOL/HDL RATIO: 2.4 (calc) (ref <5.0)
CHOLESTEROL: 166 mg/dL (ref <200)
HDL: 68 mg/dL (ref >40)
LDL CHOLESTEROL (CALC): 83 mg/dL
Non-HDL Cholesterol (Calc): 98 mg/dL (calc) (ref <130)
TRIGLYCERIDES: 74 mg/dL (ref <150)

## 2017-04-03 LAB — COMPLETE METABOLIC PANEL WITH GFR
AG Ratio: 1.5 (calc) (ref 1.0–2.5)
ALBUMIN MSPROF: 4 g/dL (ref 3.6–5.1)
ALKALINE PHOSPHATASE (APISO): 69 U/L (ref 40–115)
ALT: 73 U/L — AB (ref 9–46)
AST: 36 U/L — AB (ref 10–35)
BUN: 15 mg/dL (ref 7–25)
CO2: 27 mmol/L (ref 20–32)
CREATININE: 1.01 mg/dL (ref 0.70–1.25)
Calcium: 9.3 mg/dL (ref 8.6–10.3)
Chloride: 103 mmol/L (ref 98–110)
GFR, Est African American: 88 mL/min/{1.73_m2} (ref >=60)
GFR, Est Non African American: 76 mL/min/{1.73_m2} (ref >=60)
GLUCOSE: 108 mg/dL — AB (ref 65–99)
Globulin: 2.6 g/dL (calc) (ref 1.9–3.7)
Potassium: 4.3 mmol/L (ref 3.5–5.3)
Sodium: 139 mmol/L (ref 135–146)
Total Bilirubin: 1.1 mg/dL (ref 0.2–1.2)
Total Protein: 6.6 g/dL (ref 6.1–8.1)

## 2017-04-03 LAB — PSA: PSA: 2.9 ng/mL (ref <=4.0)

## 2017-04-07 ENCOUNTER — Encounter: Payer: Self-pay | Admitting: Internal Medicine

## 2017-04-07 ENCOUNTER — Ambulatory Visit (INDEPENDENT_AMBULATORY_CARE_PROVIDER_SITE_OTHER): Payer: Medicare Other | Admitting: Internal Medicine

## 2017-04-07 VITALS — BP 120/78 | HR 80 | Temp 97.7°F | Ht 70.0 in | Wt 166.0 lb

## 2017-04-07 DIAGNOSIS — R945 Abnormal results of liver function studies: Secondary | ICD-10-CM

## 2017-04-07 DIAGNOSIS — I2699 Other pulmonary embolism without acute cor pulmonale: Secondary | ICD-10-CM | POA: Diagnosis not present

## 2017-04-07 DIAGNOSIS — Z0000 Encounter for general adult medical examination without abnormal findings: Secondary | ICD-10-CM

## 2017-04-07 LAB — POCT URINALYSIS DIPSTICK
Bilirubin, UA: NEGATIVE
Blood, UA: NEGATIVE
Glucose, UA: NEGATIVE
KETONES UA: NEGATIVE
LEUKOCYTES UA: NEGATIVE
Nitrite, UA: NEGATIVE
PH UA: 7 (ref 5.0–8.0)
Protein, UA: NEGATIVE
Spec Grav, UA: 1.025 (ref 1.010–1.025)
Urobilinogen, UA: 0.2 E.U./dL

## 2017-04-10 ENCOUNTER — Ambulatory Visit (INDEPENDENT_AMBULATORY_CARE_PROVIDER_SITE_OTHER): Payer: Medicare Other | Admitting: Oncology

## 2017-04-10 ENCOUNTER — Telehealth: Payer: Self-pay

## 2017-04-10 ENCOUNTER — Encounter: Payer: Self-pay | Admitting: Oncology

## 2017-04-10 VITALS — BP 142/42 | HR 74 | Temp 98.0°F | Ht 70.0 in | Wt 168.9 lb

## 2017-04-10 DIAGNOSIS — I2699 Other pulmonary embolism without acute cor pulmonale: Secondary | ICD-10-CM | POA: Diagnosis present

## 2017-04-10 MED ORDER — RIVAROXABAN 20 MG PO TABS: 20.0000 mg | ORAL_TABLET | Freq: Every day | ORAL | 1 refills | Status: DC

## 2017-04-10 NOTE — Telephone Encounter (Signed)
Ordered

## 2017-04-10 NOTE — Telephone Encounter (Signed)
Pt is aware.  

## 2017-04-10 NOTE — Patient Instructions (Signed)
Return visit 6 months No lab

## 2017-04-10 NOTE — Telephone Encounter (Signed)
Pt called and stated that he was supposed to receive a refill on his Xarelto but it was not at his pharmacy, please advise

## 2017-04-10 NOTE — Progress Notes (Signed)
New Patient Hematology   James Hughes [id number] 1950-11-17 66 y.o. 04/10/2017  CC: Dr. 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  He has been in overall excellent health without any major medical or surgical illness.  Since retirement about a year ago he went on a diet and exercise program and had an intentional 30 pound weight loss.  He remains active.  Appetite is good.  He has no constitutional symptoms. He does travel a lot but all of his recent travel has been over short distances. In early October he developed a respiratory illness with severe sore throat and cough.  Sore throat resolved after about 1 week.  Nonproductive cough persisted.  He then suddenly developed acute dyspnea and near syncope while on a regular walk with his wife.  He had no chest pain or hemoptysis.  No calf pain or swelling.  He was admitted to [facility] on October 13.  Blood pressure 172/96, pulse 101, respirations 24, oxygen saturation 95%.  CT angiogram of the chest showed bilateral lobar and segmental pulmonary emboli including clot at the origin of the left main pulmonary artery and at the bifurcation of the lingular and left lower lobe pulmonary arteries.  No right heart strain by CT criteria.  No pulmonary mass or adenopathy.  Cuts through the upper abdomen showed a normal liver, pancreas, and spleen.  I personally reviewed these images. Venous Doppler studies of the lower extremities did show acute DVT from ankle to mid calf on the left.  As noted above, the patient did not have any leg pain or swelling. An echocardiogram showed ejection fraction 55-60% with normal wall motion, grade 1 diastolic dysfunction, mild mitral valve prolapse, moderate dilation of the right ventricle. Initial troponin was elevated  consistent with acute PE.  EKG with sinus tachycardia otherwise normal.  He was anticoagulated with heparin and then changed to oral Xarelto.  He just completed the 3-week loading dose and is about to start the 20 mg maintenance dose today.  He has no signs or symptoms of a collagen vascular disorder.  He tested negative for anticardiolipin antibodies, antibodies to beta-2 glycoprotein 1, and negative for presence of a lupus type anticoagulant.  Negative for prothrombin and factor V Leiden gene mutations.  Normal Antithrombin level.  Functional protein C level borderline decreased at 67% of control (73-180), normal functional protein S level 78% of control (63-140).  Normal plasma homocystine level.  Last colonoscopy done in 2009 was normal and he was put on a 10-year follow-up.  He has no current abdominal symptoms.  No change in bowel habit.  No hematochezia.  His father died of a pulmonary embolus at age 29 following an acute MI and immobilization in the hospital.  Mother who is a heavy smoker died at age 58 of lung cancer.  A brother 2 years younger and a sister 5 years younger are healthy and have not had any thromboembolic events.  He has no children.  He has fully recovered.  Back to full activity.  No dyspnea.  He is able to take a full deep breath with no effort.  He is tolerating the Xarelto.  No clinical bleeding.  PMH: History reviewed. No pertinent past medical history.  History reviewed. No pertinent surgical history.  Allergies: Allergies  Allergen  Reactions  . Penicillins Hives    Has patient had a PCN reaction causing immediate rash, facial/tongue/throat swelling, SOB or lightheadedness with hypotension: No Has patient had a PCN reaction causing severe rash involving mucus membranes or skin necrosis: No Has patient had a PCN reaction that required hospitalization: No Has patient had a PCN reaction occurring within the last 10 years: No If all of the above answers are "NO",  then may proceed with Cephalosporin use.   . Sulfa Antibiotics Hives    Medications:  Current Outpatient Medications:  .  rivaroxaban (XARELTO) 20 MG TABS tablet, Take 1 tablet (20 mg total) daily with supper by mouth., Disp: 30 tablet, Rfl: 1  Social History: Married.  Retired [employer].  Wife also recently retired.  She was the [facility] [place] [facility].  reports that  has never smoked. he has never used smokeless tobacco. He  drinks about 1.8 oz of wine per week. He does not use drugs.  Family History: Family History  Problem Relation Age of Onset  . Cancer Mother   . Pulmonary embolism Father     Review of Systems: See HPI Remaining ROS negative.  Physical Exam: Blood pressure (!) 142/42, pulse 74, temperature 98 F (36.7 C), temperature source Oral, height 5\' 10"  (1.778 m), weight 168 lb 14.4 oz (76.6 kg), SpO2 100 %. Wt Readings from Last 3 Encounters:  04/10/17 168 lb 14.4 oz (76.6 kg)  04/07/17 166 lb (75.3 kg)  03/21/17 167 lb (75.8 kg)     General appearance: Well-nourished Caucasian man HENNT: Pharynx no erythema, exudate, mass, or ulcer. No thyromegaly or thyroid nodules Lymph nodes: No cervical, supraclavicular, or axillary lymphadenopathy Breasts:  Lungs: Clear to auscultation, resonant to percussion throughout Heart: Regular rhythm, no murmur, no gallop, no rub, no click, no edema Abdomen: Soft, nontender, normal bowel sounds, no mass, no organomegaly Extremities: No edema, no calf tenderness Musculoskeletal: no joint deformities Calf measurements: 35.5 cm left, 35.5 right Ankle measurements: 22 cm left, 22 cm right GU:  Vascular: Carotid pulses 2+, no bruits, distal pulses: Dorsalis pedis not palpable but symmetric; posterior tibial pulses 1+ symmetric symmetric Neurologic: Alert, oriented, PERRLA, optic discs sharp and vessels normal, no hemorrhage or exudate, cranial nerves grossly normal, motor strength 5 over 5, reflexes 1+ symmetric,  upper body coordination normal, gait normal, Skin: No rash or ecchymosis    Lab Results: Lab Results  Component Value Date   WBC 7.1 04/03/2017   HGB 15.5 04/03/2017   HCT 45.1 04/03/2017   MCV 90.9 04/03/2017   PLT 215 04/03/2017     Chemistry      Component Value Date/Time   NA 139 04/03/2017 0936   K 4.3 04/03/2017 0936   CL 103 04/03/2017 0936   CO2 27 04/03/2017 0936   BUN 15 04/03/2017 0936   CREATININE 1.01 04/03/2017 0936      Component Value Date/Time   CALCIUM 9.3 04/03/2017 0936   ALKPHOS 51 04/11/2016 1106   AST 36 (H) 04/03/2017 0936   ALT 73 (H) 04/03/2017 0936   BILITOT 1.1 04/03/2017 0936       Radiological Studies: See discussion above    Impression: Unprovoked left lower extremity distal DVT with concomitant bilateral moderate volume pulmonary emboli with no obvious risk factors.  He did have a respiratory illness preceding the clot but no infiltrates or effusion seen on CT angiogram of the chest. He has a borderline decrease in functional protein C which is not diagnostic for  protein C deficiency.  It is more compatible with the borderline decrease that is seen at the time of an acute thrombotic event.  True protein S and C deficiency have levels in the range of 30-40% of control, are invariably diagnosed prior to the age of 24, and due to high penetrance, are almost always associated with a strong family history.    Recommendation: We had a long discussion about provoked versus unprovoked venous thromboembolism. Although at his age we do need to include in the differential the possibility of an occult cancer, this is quite rare occurring in only approximately 4% of people presenting with unexplained thrombosis and in view of the absence of constitutional symptoms, normal physical examination, normal CT scans of the chest and upper abdomen, remote but normal colonoscopy and no current GI symptoms, no further testing is indicated in this regard.  He  needs to be on full dose anticoagulation for an indefinite length of time.  We discussed the 2 large clinical trials which looked at patients with intermediate or poorly defined risk for recurrent events who were randomized to full dose or 50% dose of either apixaban in one trial or rivaroxaban in the second trial.  Compared with either placebo or aspirin, these patients had equivalent protection against recurrent thrombotic events at the 50% dose reduction.  Of note, this trial excluded patients at high risk for recurrence.  Since we did not determine any definite genetic risk factors or acquired coagulopathies such as a myeloproliferative disorder or antiphospholipid antibody syndrome, I think he could be put in this category and that the following 1 year of full dose anticoagulation we can consider dose reduction. I will see him again in 6 months to assess his progress.   Murriel Hopper, MD, [place]  Hematology-Oncology/Internal Medicine  04/10/2017, 5:34 PM

## 2017-04-11 NOTE — Progress Notes (Signed)
Thanks- always learn something from your careful evaluations.

## 2017-04-21 ENCOUNTER — Other Ambulatory Visit: Payer: Medicare Other | Admitting: Internal Medicine

## 2017-04-21 DIAGNOSIS — I2699 Other pulmonary embolism without acute cor pulmonale: Secondary | ICD-10-CM | POA: Diagnosis not present

## 2017-04-22 LAB — HEPATIC FUNCTION PANEL
AG Ratio: 1.9 (calc) (ref 1.0–2.5)
ALBUMIN MSPROF: 4.1 g/dL (ref 3.6–5.1)
ALT: 65 U/L — AB (ref 9–46)
AST: 31 U/L (ref 10–35)
Alkaline phosphatase (APISO): 63 U/L (ref 40–115)
BILIRUBIN DIRECT: 0.3 mg/dL — AB (ref 0.0–0.2)
BILIRUBIN TOTAL: 1.6 mg/dL — AB (ref 0.2–1.2)
GLOBULIN: 2.2 g/dL (ref 1.9–3.7)
Indirect Bilirubin: 1.3 mg/dL (calc) — ABNORMAL HIGH (ref 0.2–1.2)
Total Protein: 6.3 g/dL (ref 6.1–8.1)

## 2017-04-22 LAB — HEMOGLOBIN A1C
HEMOGLOBIN A1C: 5.6 %{Hb} (ref <5.7)
MEAN PLASMA GLUCOSE: 114 (calc)
eAG (mmol/L): 6.3 (calc)

## 2017-04-24 ENCOUNTER — Ambulatory Visit (INDEPENDENT_AMBULATORY_CARE_PROVIDER_SITE_OTHER): Payer: Medicare Other | Admitting: Internal Medicine

## 2017-04-24 ENCOUNTER — Encounter: Payer: Self-pay | Admitting: Internal Medicine

## 2017-04-24 VITALS — BP 132/74 | HR 86 | Temp 98.4°F | Wt 172.0 lb

## 2017-04-24 DIAGNOSIS — R399 Unspecified symptoms and signs involving the genitourinary system: Secondary | ICD-10-CM | POA: Diagnosis not present

## 2017-04-24 DIAGNOSIS — Z7901 Long term (current) use of anticoagulants: Secondary | ICD-10-CM | POA: Diagnosis not present

## 2017-04-24 DIAGNOSIS — R7989 Other specified abnormal findings of blood chemistry: Secondary | ICD-10-CM

## 2017-04-24 DIAGNOSIS — I2699 Other pulmonary embolism without acute cor pulmonale: Secondary | ICD-10-CM | POA: Diagnosis not present

## 2017-04-24 DIAGNOSIS — R945 Abnormal results of liver function studies: Secondary | ICD-10-CM | POA: Diagnosis not present

## 2017-04-24 DIAGNOSIS — N410 Acute prostatitis: Secondary | ICD-10-CM | POA: Diagnosis not present

## 2017-04-24 LAB — POCT URINALYSIS DIPSTICK
Bilirubin, UA: NEGATIVE
Glucose, UA: NEGATIVE
KETONES UA: NEGATIVE
LEUKOCYTES UA: NEGATIVE
Nitrite, UA: NEGATIVE
PH UA: 6 (ref 5.0–8.0)
PROTEIN UA: NEGATIVE
RBC UA: NEGATIVE
SPEC GRAV UA: 1.02 (ref 1.010–1.025)
Urobilinogen, UA: 0.2 E.U./dL

## 2017-04-24 MED ORDER — DOXYCYCLINE HYCLATE 100 MG PO TABS: 100.0000 mg | ORAL_TABLET | Freq: Two times a day (BID) | ORAL | 0 refills | Status: DC

## 2017-04-24 MED ORDER — RIVAROXABAN 20 MG PO TABS: 20.0000 mg | ORAL_TABLET | Freq: Every day | ORAL | 5 refills | Status: DC

## 2017-04-24 NOTE — Progress Notes (Signed)
   Subjective:    Patient ID: FLEMON KELTY, male    DOB: 01-09-1951, 66 y.o.   MRN: [id number]  HPI Pleasant 66 year old male here today to follow-up on mild elevation of liver functions.  However, today he has an acute problem with dysuria onset of the weekend.  He thought it might have been caused by drinking 2 glasses of red wine on Friday.  History of bilateral pulmonary emboli recently saw Dr. Beryle Beams who recommended 6 months of anticoagulation.  He is currently on Xarelto.  He has not had prostatitis symptoms in a number of years.  Maybe only had one episode in the remote past.  No fever or shaking chills but has been lethargic.    Review of Systems see above     Objective:   Physical Exam  His urine dipstick is normal.  This was obtained after prostatic exam.  His prostate is slightly enlarged and slightly boggy.  No hematuria on urine dipstick.      Assessment & Plan:  Prostatitis  Elevated liver functions-make appointment for him to see Dr. Cristina Gong in the near future  Bilateral pulmonary emboli-on Xarelto for 6 months per Dr. Beryle Beams  Plan: Doxycycline 100 mg twice daily for 14 days.  Rest and drink plenty of fluids.  Addendum: Urine culture no growth

## 2017-04-25 LAB — URINE CULTURE
MICRO NUMBER:: 81302383
RESULT: NO GROWTH
SPECIMEN QUALITY: ADEQUATE
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
## 2017-04-29 ENCOUNTER — Encounter: Payer: Self-pay | Admitting: Internal Medicine

## 2017-04-29 DIAGNOSIS — R748 Abnormal levels of other serum enzymes: Secondary | ICD-10-CM

## 2017-04-29 DIAGNOSIS — N419 Inflammatory disease of prostate, unspecified: Secondary | ICD-10-CM

## 2017-04-29 HISTORY — DX: Abnormal levels of other serum enzymes: R74.8

## 2017-04-29 HISTORY — DX: Inflammatory disease of prostate, unspecified: N41.9

## 2017-04-29 NOTE — Patient Instructions (Signed)
Appointment with Dr. Cristina Gong in the near future acute prostatitis doxycycline 100 mg twice daily for 2 weeks.

## 2017-04-29 NOTE — Patient Instructions (Signed)
Patient will stop drinking alcohol for couple of weeks and follow-up with repeat liver functions and office visit.  Continue Xarelto for anticoagulation for bilateral pulmonary emboli.

## 2017-05-12 ENCOUNTER — Ambulatory Visit
Admission: RE | Admit: 2017-05-12 | Discharge: 2017-05-12 | Disposition: A | Discharge status: Home / Self Care | Payer: Medicare Other | Source: Ambulatory Visit | Attending: Internal Medicine | Admitting: Internal Medicine

## 2017-05-12 DIAGNOSIS — R945 Abnormal results of liver function studies: Secondary | ICD-10-CM

## 2017-05-12 DIAGNOSIS — K7689 Other specified diseases of liver: Secondary | ICD-10-CM | POA: Diagnosis not present

## 2017-05-12 MED ORDER — IOPAMIDOL (ISOVUE-300) INJECTION 61%
100.0000 mL | Freq: Once | INTRAVENOUS | Status: AC | PRN
  Administered 2017-05-12: 100 mL via INTRAVENOUS

## 2017-05-23 DIAGNOSIS — R740 Nonspecific elevation of levels of transaminase and lactic acid dehydrogenase [LDH]: Secondary | ICD-10-CM | POA: Diagnosis not present

## 2017-05-23 DIAGNOSIS — Z1211 Encounter for screening for malignant neoplasm of colon: Secondary | ICD-10-CM | POA: Diagnosis not present

## 2017-08-14 DIAGNOSIS — R740 Nonspecific elevation of levels of transaminase and lactic acid dehydrogenase [LDH]: Secondary | ICD-10-CM | POA: Diagnosis not present

## 2017-08-17 DIAGNOSIS — R350 Frequency of micturition: Secondary | ICD-10-CM | POA: Diagnosis not present

## 2017-08-17 DIAGNOSIS — R3911 Hesitancy of micturition: Secondary | ICD-10-CM | POA: Diagnosis not present

## 2017-08-17 DIAGNOSIS — R300 Dysuria: Secondary | ICD-10-CM | POA: Diagnosis not present

## 2017-08-17 DIAGNOSIS — R3915 Urgency of urination: Secondary | ICD-10-CM | POA: Diagnosis not present

## 2017-08-17 DIAGNOSIS — N390 Urinary tract infection, site not specified: Secondary | ICD-10-CM | POA: Diagnosis not present

## 2017-08-19 DIAGNOSIS — R339 Retention of urine, unspecified: Secondary | ICD-10-CM | POA: Diagnosis not present

## 2017-08-19 DIAGNOSIS — Z7901 Long term (current) use of anticoagulants: Secondary | ICD-10-CM | POA: Diagnosis not present

## 2017-08-19 DIAGNOSIS — Z86711 Personal history of pulmonary embolism: Secondary | ICD-10-CM | POA: Diagnosis not present

## 2017-08-20 ENCOUNTER — Telehealth: Payer: Self-pay | Admitting: Internal Medicine

## 2017-08-20 NOTE — Telephone Encounter (Signed)
Pt called this am from [facility]. Had to go to ED after being seen at an Urgent Care late last week and placed on antibiotics for presumed prostatitis although urine specimen at urgent care looked OK. Pt now has foley catheter because of urinary retention and remains on Cipro and will not be home until Wednesday evening. Wants to see Urologist Thursday.

## 2017-08-21 DIAGNOSIS — N139 Obstructive and reflux uropathy, unspecified: Secondary | ICD-10-CM | POA: Diagnosis not present

## 2017-08-21 DIAGNOSIS — Z7901 Long term (current) use of anticoagulants: Secondary | ICD-10-CM | POA: Diagnosis not present

## 2017-08-21 DIAGNOSIS — Z960 Presence of urogenital implants: Secondary | ICD-10-CM | POA: Diagnosis not present

## 2017-08-21 DIAGNOSIS — Z86711 Personal history of pulmonary embolism: Secondary | ICD-10-CM | POA: Diagnosis not present

## 2017-08-21 DIAGNOSIS — Z86718 Personal history of other venous thrombosis and embolism: Secondary | ICD-10-CM | POA: Diagnosis not present

## 2017-08-21 DIAGNOSIS — R319 Hematuria, unspecified: Secondary | ICD-10-CM | POA: Diagnosis not present

## 2017-08-24 DIAGNOSIS — N401 Enlarged prostate with lower urinary tract symptoms: Secondary | ICD-10-CM | POA: Diagnosis not present

## 2017-08-24 DIAGNOSIS — R338 Other retention of urine: Secondary | ICD-10-CM | POA: Diagnosis not present

## 2017-08-25 ENCOUNTER — Encounter: Payer: Self-pay | Admitting: Internal Medicine

## 2017-08-30 ENCOUNTER — Telehealth: Payer: Self-pay | Admitting: Internal Medicine

## 2017-08-30 NOTE — Telephone Encounter (Signed)
Call returned to pt.He has questions about lack of improvement and next steps. I have asked Dr.Ottelin's nurse to speak with Dr. Karsten Ro about patient's questions.

## 2017-08-30 NOTE — Telephone Encounter (Signed)
Patient called this morning and states that he just wants to speak with you for a few moments.  He has been to the Urologist twice since he has returned from [place].  And, he really just wants to talk with your briefly after seeing Urologist Karsten Ro) those 2 visits.    Wants to know if you will please call him for a brief conversation.    Phone:  [phone]  Thank you.

## 2017-08-31 DIAGNOSIS — R339 Retention of urine, unspecified: Secondary | ICD-10-CM

## 2017-09-05 DIAGNOSIS — N401 Enlarged prostate with lower urinary tract symptoms: Secondary | ICD-10-CM | POA: Diagnosis not present

## 2017-09-05 DIAGNOSIS — R338 Other retention of urine: Secondary | ICD-10-CM | POA: Diagnosis not present

## 2017-09-07 DIAGNOSIS — R740 Nonspecific elevation of levels of transaminase and lactic acid dehydrogenase [LDH]: Secondary | ICD-10-CM | POA: Diagnosis not present

## 2017-09-07 DIAGNOSIS — Z1211 Encounter for screening for malignant neoplasm of colon: Secondary | ICD-10-CM | POA: Diagnosis not present

## 2017-10-09 ENCOUNTER — Encounter: Payer: Self-pay | Admitting: Oncology

## 2017-10-09 ENCOUNTER — Ambulatory Visit (INDEPENDENT_AMBULATORY_CARE_PROVIDER_SITE_OTHER): Payer: Medicare Other | Admitting: Oncology

## 2017-10-09 ENCOUNTER — Other Ambulatory Visit: Payer: Self-pay

## 2017-10-09 ENCOUNTER — Telehealth: Payer: Self-pay | Admitting: *Deleted

## 2017-10-09 VITALS — BP 131/62 | HR 74 | Temp 98.2°F | Ht 70.0 in | Wt 177.5 lb

## 2017-10-09 DIAGNOSIS — Z882 Allergy status to sulfonamides status: Secondary | ICD-10-CM | POA: Diagnosis not present

## 2017-10-09 DIAGNOSIS — R338 Other retention of urine: Secondary | ICD-10-CM

## 2017-10-09 DIAGNOSIS — I2782 Chronic pulmonary embolism: Secondary | ICD-10-CM | POA: Diagnosis not present

## 2017-10-09 DIAGNOSIS — I82542 Chronic embolism and thrombosis of left tibial vein: Secondary | ICD-10-CM

## 2017-10-09 DIAGNOSIS — N401 Enlarged prostate with lower urinary tract symptoms: Secondary | ICD-10-CM

## 2017-10-09 DIAGNOSIS — Z79899 Other long term (current) drug therapy: Secondary | ICD-10-CM

## 2017-10-09 DIAGNOSIS — Z7901 Long term (current) use of anticoagulants: Secondary | ICD-10-CM | POA: Diagnosis not present

## 2017-10-09 DIAGNOSIS — Z880 Allergy status to penicillin: Secondary | ICD-10-CM | POA: Diagnosis not present

## 2017-10-09 LAB — BASIC METABOLIC PANEL
ANION GAP: 8 (ref 5–15)
BUN: 10 mg/dL (ref 6–20)
CALCIUM: 9.1 mg/dL (ref 8.9–10.3)
CHLORIDE: 106 mmol/L (ref 101–111)
CO2: 25 mmol/L (ref 22–32)
Creatinine, Ser: 0.94 mg/dL (ref 0.61–1.24)
GFR calc Af Amer: >60 mL/min (ref >60)
GFR calc non Af Amer: >60 mL/min (ref >60)
GLUCOSE: 106 mg/dL — AB (ref 65–99)
POTASSIUM: 4.1 mmol/L (ref 3.5–5.1)
Sodium: 139 mmol/L (ref 135–145)

## 2017-10-09 LAB — CBC WITH DIFFERENTIAL/PLATELET
BASOS ABS: 0 10*3/uL (ref 0.0–0.1)
Basophils Relative: 0 %
EOS PCT: 1 %
Eosinophils Absolute: 0.1 10*3/uL (ref 0.0–0.7)
HEMATOCRIT: 42.1 % (ref 39.0–52.0)
Hemoglobin: 14.4 g/dL (ref 13.0–17.0)
LYMPHS PCT: 30 %
Lymphs Abs: 1.5 10*3/uL (ref 0.7–4.0)
MCH: 31 pg (ref 26.0–34.0)
MCHC: 34.2 g/dL (ref 30.0–36.0)
MCV: 90.7 fL (ref 78.0–100.0)
Monocytes Absolute: 0.3 10*3/uL (ref 0.1–1.0)
Monocytes Relative: 7 %
Neutro Abs: 3 10*3/uL (ref 1.7–7.7)
Neutrophils Relative %: 62 %
PLATELETS: 164 10*3/uL (ref 150–400)
RBC: 4.64 MIL/uL (ref 4.22–5.81)
RDW: 12.7 % (ref 11.5–15.5)
WBC: 4.9 10*3/uL (ref 4.0–10.5)

## 2017-10-09 LAB — D-DIMER, QUANTITATIVE (NOT AT ARMC)

## 2017-10-09 MED ORDER — RIVAROXABAN 10 MG PO TABS: 10.0000 mg | ORAL_TABLET | Freq: Every day | ORAL | 6 refills | Status: DC

## 2017-10-09 NOTE — Telephone Encounter (Signed)
Pt called / informed "normal kidney function; normal red blood cell count; undetectable d-dimer test: show your body is not in active clotting mode - this is a good thing." per Dr Beryle Beams.

## 2017-10-09 NOTE — Telephone Encounter (Signed)
-----   Message from Annia Belt, MD sent at 10/09/2017  9:59 AM EDT ----- Call pt: normal kidney function; normal red blood cell count; undetectable d-dimer test: show your body is not in active clotting mode - this is a good thing.

## 2017-10-09 NOTE — Patient Instructions (Signed)
To lab today Return visit 6 months 

## 2017-10-09 NOTE — Progress Notes (Signed)
Hematology and Oncology Follow Up Visit  James Hughes [id number] November 23, 1950 67 y.o. 10/09/2017 3:06 PM   Principle Diagnosis: Encounter Diagnoses  Name Primary?  . Other chronic pulmonary embolism without acute cor pulmonale (HCC) Yes  . Chronic anticoagulation   . 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  No evidence for right heart strain on CT scan but moderate dilation of the right ventricle on echo.  Initial elevation of troponin.  Anticoagulated with heparin and then changed to Xarelto 20 mg daily.  Hypercoagulation profile negative.  Borderline decrease in protein C which I believe is not significant.  No signs or symptoms to suggest underlying malignancy.  Family history notable for a pulmonary embolus in his father at age 70 following an acute MI and prolonged immobilization in the hospital.   Interim History: Overall doing well.  He was on vacation in [place] in December 2018 when he developed acute urinary retention.  He received medical care in a local emergency department and a Foley catheter was placed until he had further urologic evaluation in [place].  He saw Dr. Kathie Rhodes.  Prostate was enlarged on exam.  Flomax dose was increased 2.4 mg twice daily and finasteride was added.  Foley catheter long since removed.  He had minor liver function elevations done through his primary care physician's office.  He was referred to GI, Dr. Cristina Gong.    He was advised to completely stop alcoholic beverages.  1 month later liver functions were repeated and normal.  He was scheduled for a routine 10-year interval colonoscopy which he will have soon.  He is still getting an intermittent  sticking sensation in his mid upper chest when he takes a deep breath but he feels he is back to his baseline activity and is not dyspneic, not having any chest pain or palpitations.  Left calf still feels uncomfortable at times but not painful.  He has had no problems on the Xarelto.  No severe headache, change in vision, epistaxis, gum bleeding, hematuria, or hematochezia.  He is planning a trip to [place] with his wife in the fall.  Medications: reviewed  Allergies:  Allergies  Allergen Reactions  . Penicillins Hives    Has patient had a PCN reaction causing immediate rash, facial/tongue/throat swelling, SOB or lightheadedness with hypotension: No Has patient had a PCN reaction causing severe rash involving mucus membranes or skin necrosis: No Has patient had a PCN reaction that required hospitalization: No Has patient had a PCN reaction occurring within the last 10 years: No If all of the above answers are "NO", then may proceed with Cephalosporin use.   . Sulfa Antibiotics Hives    Review of Systems: See interim history Remaining ROS negative:   Physical Exam: Blood pressure 131/62, pulse 74, temperature 98.2 F (36.8 C), temperature source Oral, height 5\' 10"  (1.778 m), weight 177 lb 8 oz (80.5 kg), SpO2 100 %. Wt Readings from Last 3 Encounters:  10/09/17 177 lb 8 oz (80.5 kg)  04/24/17 172 lb (78 kg)  04/10/17 168 lb 14.4 oz (76.6 kg)     General appearance: Well-nourished Caucasian man HENNT: Pharynx no erythema, exudate, mass, or ulcer. No thyromegaly or thyroid nodules Lymph nodes: No cervical, supraclavicular, or  axillary lymphadenopathy Breasts:  Lungs: Clear to auscultation, resonant to percussion throughout Heart: Regular rhythm, no murmur, no gallop, no rub, no click, no edema Abdomen: Soft, nontender, normal bowel sounds, no mass, no organomegaly Extremities: No edema, no calf tenderness Musculoskeletal: no joint deformities GU:  Vascular: Carotid pulses  2+, no bruits,  Neurologic: Alert, oriented, PERRLA, optic discs sharp and vessels normal, no hemorrhage or exudate, cranial nerves grossly normal, motor strength 5 over 5, reflexes 1+ symmetric, upper body coordination normal, gait normal, Skin: No rash or ecchymosis  Lab Results: CBC W/Diff    Component Value Date/Time   WBC 4.9 10/09/2017 0900   RBC 4.64 10/09/2017 0900   HGB 14.4 10/09/2017 0900   HCT 42.1 10/09/2017 0900   PLT 164 10/09/2017 0900   MCV 90.7 10/09/2017 0900   MCH 31.0 10/09/2017 0900   MCHC 34.2 10/09/2017 0900   RDW 12.7 10/09/2017 0900   LYMPHSABS 1.5 10/09/2017 0900   MONOABS 0.3 10/09/2017 0900   EOSABS 0.1 10/09/2017 0900   BASOSABS 0.0 10/09/2017 0900     Chemistry      Component Value Date/Time   NA 139 10/09/2017 0900   K 4.1 10/09/2017 0900   CL 106 10/09/2017 0900   CO2 25 10/09/2017 0900   BUN 10 10/09/2017 0900   CREATININE 0.94 10/09/2017 0900   CREATININE 1.01 04/03/2017 0936      Component Value Date/Time   CALCIUM 9.1 10/09/2017 0900   ALKPHOS 51 04/11/2016 1106   AST 31 04/21/2017 0948   ALT 65 (H) 04/21/2017 0948   BILITOT 1.6 (H) 04/21/2017 0948       Radiological Studies: No results found.  Impression:  1.  Bilateral pulmonary emboli and distal left lower extremity DVT which occurred coincident with an upper respiratory illness.  No gross risk factors for thromboembolic disease and negative hypercoagulation profile. I would put him in the intermediate risk group.  We had discussed transitioning from full dose to attenuated dose anticoagulation after 6-12 months.  D-dimer today is undetectable.  He is doing well clinically.  He just refilled his 20 mg prescription.  I told him to go ahead and stay on the 20 mg for the next month.  I will then send in a prescription for 10 mg to take on a chronic basis.  2.  BPH with recent episode of urinary retention now on both Flomax and finasteride.  CC: Patient Care Team: Elby Showers, MD as PCP - General (Internal Medicine)   Murriel Hopper, MD, [place]  Hematology-Oncology/Internal Medicine     5/6/20193:06 PM

## 2017-11-02 ENCOUNTER — Other Ambulatory Visit: Payer: Self-pay | Admitting: Internal Medicine

## 2017-11-06 ENCOUNTER — Ambulatory Visit (INDEPENDENT_AMBULATORY_CARE_PROVIDER_SITE_OTHER): Payer: Medicare Other | Admitting: Internal Medicine

## 2017-11-06 ENCOUNTER — Encounter: Payer: Self-pay | Admitting: Internal Medicine

## 2017-11-06 VITALS — BP 140/80 | HR 78 | Temp 98.3°F | Ht 70.0 in | Wt 175.0 lb

## 2017-11-06 DIAGNOSIS — L720 Epidermal cyst: Secondary | ICD-10-CM

## 2017-11-06 MED ORDER — CEPHALEXIN 500 MG PO CAPS: 500.0000 mg | ORAL_CAPSULE | Freq: Four times a day (QID) | ORAL | 0 refills | Status: DC

## 2017-11-13 ENCOUNTER — Encounter: Payer: Self-pay | Admitting: Internal Medicine

## 2017-11-13 DIAGNOSIS — D126 Benign neoplasm of colon, unspecified: Secondary | ICD-10-CM | POA: Diagnosis not present

## 2017-11-13 DIAGNOSIS — Z800 Family history of malignant neoplasm of digestive organs: Secondary | ICD-10-CM | POA: Diagnosis not present

## 2017-11-13 DIAGNOSIS — Z1211 Encounter for screening for malignant neoplasm of colon: Secondary | ICD-10-CM | POA: Diagnosis not present

## 2017-11-15 DIAGNOSIS — D126 Benign neoplasm of colon, unspecified: Secondary | ICD-10-CM | POA: Diagnosis not present

## 2017-11-26 NOTE — Progress Notes (Signed)
   Subjective:    Patient ID: James Hughes, male    DOB: 10-21-1950, 67 y.o.   MRN: [id number]  HPI 67 year old Male in today requesting evaluation of lesion on his upper back.  No pain, redness, or drainage.    Review of Systems see above     Objective:   Physical Exam Has a quarter sized cystic lesion upper back that is not red  and is not draining       Assessment & Plan:  Epidermoid cyst upper back  Plan: Refer to dermatologist for excision.  Spent 15 minutes speaking with him about cyst, possibility of it becoming infected, benign status, and discussion of referral to dermatologist.  Patient is in agreement.

## 2017-11-26 NOTE — Patient Instructions (Signed)
Patient will be referred to dermatologist for excision of epidermoid cyst

## 2017-12-03 ENCOUNTER — Encounter: Payer: Self-pay | Admitting: Internal Medicine

## 2017-12-03 ENCOUNTER — Telehealth: Payer: Self-pay | Admitting: Internal Medicine

## 2017-12-03 ENCOUNTER — Other Ambulatory Visit: Payer: Self-pay | Admitting: Internal Medicine

## 2017-12-03 MED ORDER — RIVAROXABAN 10 MG PO TABS: 10.0000 mg | ORAL_TABLET | Freq: Every day | ORAL | 5 refills | Status: DC

## 2017-12-03 NOTE — Telephone Encounter (Signed)
Note in May from Dr. Beryle Beams says pt should be changed after most recent Rx to Xarelto 10 mg daily. Have sent this e scribe AND left message for pharmacist.

## 2017-12-13 DIAGNOSIS — L723 Sebaceous cyst: Secondary | ICD-10-CM | POA: Diagnosis not present

## 2018-01-31 DIAGNOSIS — Z23 Encounter for immunization: Secondary | ICD-10-CM | POA: Diagnosis not present

## 2018-03-19 DIAGNOSIS — Z23 Encounter for immunization: Secondary | ICD-10-CM | POA: Diagnosis not present

## 2018-03-19 DIAGNOSIS — D225 Melanocytic nevi of trunk: Secondary | ICD-10-CM | POA: Diagnosis not present

## 2018-03-19 DIAGNOSIS — L821 Other seborrheic keratosis: Secondary | ICD-10-CM | POA: Diagnosis not present

## 2018-03-19 DIAGNOSIS — L814 Other melanin hyperpigmentation: Secondary | ICD-10-CM | POA: Diagnosis not present

## 2018-03-19 DIAGNOSIS — Z86018 Personal history of other benign neoplasm: Secondary | ICD-10-CM | POA: Diagnosis not present

## 2018-03-21 DIAGNOSIS — R338 Other retention of urine: Secondary | ICD-10-CM | POA: Diagnosis not present

## 2018-03-21 DIAGNOSIS — N401 Enlarged prostate with lower urinary tract symptoms: Secondary | ICD-10-CM | POA: Diagnosis not present

## 2018-04-04 ENCOUNTER — Other Ambulatory Visit: Payer: Self-pay | Admitting: Internal Medicine

## 2018-04-04 DIAGNOSIS — Z125 Encounter for screening for malignant neoplasm of prostate: Secondary | ICD-10-CM

## 2018-04-04 DIAGNOSIS — Z0000 Encounter for general adult medical examination without abnormal findings: Secondary | ICD-10-CM

## 2018-04-04 DIAGNOSIS — I2699 Other pulmonary embolism without acute cor pulmonale: Secondary | ICD-10-CM

## 2018-04-04 DIAGNOSIS — H4323 Crystalline deposits in vitreous body, bilateral: Secondary | ICD-10-CM | POA: Diagnosis not present

## 2018-04-04 DIAGNOSIS — Z7901 Long term (current) use of anticoagulants: Secondary | ICD-10-CM

## 2018-04-06 ENCOUNTER — Other Ambulatory Visit: Payer: Medicare Other | Admitting: Internal Medicine

## 2018-04-06 DIAGNOSIS — I2699 Other pulmonary embolism without acute cor pulmonale: Secondary | ICD-10-CM | POA: Diagnosis not present

## 2018-04-06 DIAGNOSIS — Z125 Encounter for screening for malignant neoplasm of prostate: Secondary | ICD-10-CM | POA: Diagnosis not present

## 2018-04-06 DIAGNOSIS — Z7901 Long term (current) use of anticoagulants: Secondary | ICD-10-CM | POA: Diagnosis not present

## 2018-04-06 DIAGNOSIS — Z0000 Encounter for general adult medical examination without abnormal findings: Secondary | ICD-10-CM

## 2018-04-06 LAB — CBC WITH DIFFERENTIAL/PLATELET
BASOS ABS: 41 {cells}/uL (ref 0–200)
Basophils Relative: 0.7 %
EOS PCT: 1.7 %
Eosinophils Absolute: 99 cells/uL (ref 15–500)
HEMATOCRIT: 44.2 % (ref 38.5–50.0)
HEMOGLOBIN: 15 g/dL (ref 13.2–17.1)
LYMPHS ABS: 1630 {cells}/uL (ref 850–3900)
MCH: 31.1 pg (ref 27.0–33.0)
MCHC: 33.9 g/dL (ref 32.0–36.0)
MCV: 91.7 fL (ref 80.0–100.0)
MPV: 9.8 fL (ref 7.5–12.5)
Monocytes Relative: 8.3 %
NEUTROS ABS: 3550 {cells}/uL (ref 1500–7800)
Neutrophils Relative %: 61.2 %
Platelets: 174 10*3/uL (ref 140–400)
RBC: 4.82 10*6/uL (ref 4.20–5.80)
RDW: 12 % (ref 11.0–15.0)
Total Lymphocyte: 28.1 %
WBC: 5.8 10*3/uL (ref 3.8–10.8)
WBCMIX: 481 {cells}/uL (ref 200–950)

## 2018-04-06 LAB — COMPLETE METABOLIC PANEL WITH GFR
AG Ratio: 2 (calc) (ref 1.0–2.5)
ALBUMIN MSPROF: 4.2 g/dL (ref 3.6–5.1)
ALKALINE PHOSPHATASE (APISO): 57 U/L (ref 40–115)
ALT: 24 U/L (ref 9–46)
AST: 20 U/L (ref 10–35)
BILIRUBIN TOTAL: 1.2 mg/dL (ref 0.2–1.2)
BUN: 16 mg/dL (ref 7–25)
CHLORIDE: 106 mmol/L (ref 98–110)
CO2: 27 mmol/L (ref 20–32)
Calcium: 9 mg/dL (ref 8.6–10.3)
Creat: 1.09 mg/dL (ref 0.70–1.25)
GFR, EST AFRICAN AMERICAN: 80 mL/min/{1.73_m2} (ref >=60)
GFR, Est Non African American: 69 mL/min/{1.73_m2} (ref >=60)
GLUCOSE: 94 mg/dL (ref 65–99)
Globulin: 2.1 g/dL (calc) (ref 1.9–3.7)
Potassium: 4.4 mmol/L (ref 3.5–5.3)
Sodium: 141 mmol/L (ref 135–146)
TOTAL PROTEIN: 6.3 g/dL (ref 6.1–8.1)

## 2018-04-06 LAB — LIPID PANEL
Cholesterol: 147 mg/dL (ref <200)
HDL: 50 mg/dL (ref >40)
LDL Cholesterol (Calc): 79 mg/dL (calc)
Non-HDL Cholesterol (Calc): 97 mg/dL (calc) (ref <130)
TRIGLYCERIDES: 93 mg/dL (ref <150)
Total CHOL/HDL Ratio: 2.9 (calc) (ref <5.0)

## 2018-04-06 LAB — PSA: PSA: 1.6 ng/mL (ref <=4.0)

## 2018-04-10 ENCOUNTER — Encounter: Payer: Self-pay | Admitting: Internal Medicine

## 2018-04-10 ENCOUNTER — Ambulatory Visit (INDEPENDENT_AMBULATORY_CARE_PROVIDER_SITE_OTHER): Payer: Medicare Other | Admitting: Internal Medicine

## 2018-04-10 VITALS — BP 140/80 | HR 68 | Temp 98.2°F | Ht 70.0 in | Wt 178.0 lb

## 2018-04-10 DIAGNOSIS — Z86711 Personal history of pulmonary embolism: Secondary | ICD-10-CM

## 2018-04-10 DIAGNOSIS — N400 Enlarged prostate without lower urinary tract symptoms: Secondary | ICD-10-CM

## 2018-04-10 DIAGNOSIS — Z0000 Encounter for general adult medical examination without abnormal findings: Secondary | ICD-10-CM

## 2018-04-10 DIAGNOSIS — Z7901 Long term (current) use of anticoagulants: Secondary | ICD-10-CM

## 2018-04-10 LAB — POCT URINALYSIS DIPSTICK
APPEARANCE: NORMAL
Bilirubin, UA: NEGATIVE
Blood, UA: NEGATIVE
Glucose, UA: NEGATIVE
Ketones, UA: NEGATIVE
Leukocytes, UA: NEGATIVE
NITRITE UA: NEGATIVE
ODOR: NORMAL
PROTEIN UA: NEGATIVE
Spec Grav, UA: 1.015 (ref 1.010–1.025)
Urobilinogen, UA: 0.2 E.U./dL
pH, UA: 6.5 (ref 5.0–8.0)

## 2018-04-10 MED FILL — SHINGRIX 50 MCG SUS: 50 | 1 days supply | Qty: 1 | Fill #0

## 2018-04-10 NOTE — Patient Instructions (Signed)
It was a pleasure to see you today.  We will see if Dr. Beryle Beams want you to stay with chronic Xarelto therapy.  Prescription given for Shingrix vaccine.  Labs look good.  Return in 1 year or as needed.
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
## 2018-04-12 NOTE — Progress Notes (Signed)
Thanks. Sounds like to me he needs to stay on current dose indefinitely. Will share with him.

## 2018-04-12 NOTE — Progress Notes (Signed)
Pt will remain on anticoagulant indefinitely. Spoke with pt regarding Dr. Azucena Freed recommendation. I agree with that.

## 2018-04-25 ENCOUNTER — Other Ambulatory Visit: Payer: Self-pay | Admitting: Internal Medicine

## 2018-05-14 ENCOUNTER — Ambulatory Visit (INDEPENDENT_AMBULATORY_CARE_PROVIDER_SITE_OTHER): Payer: Medicare Other | Admitting: Internal Medicine

## 2018-05-14 ENCOUNTER — Encounter: Payer: Self-pay | Admitting: Internal Medicine

## 2018-05-14 VITALS — BP 120/80 | HR 78 | Temp 98.3°F | Ht 70.0 in | Wt 183.0 lb

## 2018-05-14 DIAGNOSIS — J22 Unspecified acute lower respiratory infection: Secondary | ICD-10-CM

## 2018-05-14 MED ORDER — HYDROCODONE-HOMATROPINE 5-1.5 MG/5ML PO SYRP: 5.0000 mL | ORAL_SOLUTION | Freq: Three times a day (TID) | ORAL | 0 refills | Status: DC | PRN

## 2018-05-14 MED ORDER — DOXYCYCLINE HYCLATE 100 MG PO TABS: 100.0000 mg | ORAL_TABLET | Freq: Two times a day (BID) | ORAL | 0 refills | Status: DC

## 2018-05-14 NOTE — Progress Notes (Signed)
   Subjective:    Patient ID: James Hughes, male    DOB: Sep 05, 1950, 67 y.o.   MRN: [id number]  HPI Patient is come down with respiratory infection and has had significant cough.  He has had some low-grade fever and nasal congestion.  No ear pain.  Did take flu vaccine.    Review of Systems see above     Objective:   Physical Exam  Skin warm and dry.  Pharynx injected without exudate.  TMs are clear.  Neck is supple.  He sounds hoarse and congested.  No adenopathy.  Chest clear to auscultation without rales or wheezing.      Assessment & Plan:  Acute lower respiratory infection  Plan: Doxycycline 100 mg twice daily for 10 days.  Hycodan 1 teaspoon p.o. every 8 hours as needed cough.  Rest and drink plenty of fluids.  He had flu vaccine in November

## 2018-05-14 NOTE — Patient Instructions (Signed)
Rest and drink plenty of fluids.  Doxycycline 100 mg twice daily for 10 days.  Hycodan 1 teaspoon p.o. every 8 hours sparingly as needed cough.

## 2018-05-25 ENCOUNTER — Other Ambulatory Visit: Payer: Self-pay | Admitting: Oncology

## 2018-05-25 DIAGNOSIS — I2782 Chronic pulmonary embolism: Secondary | ICD-10-CM

## 2018-05-25 DIAGNOSIS — I825Z9 Chronic embolism and thrombosis of unspecified deep veins of unspecified distal lower extremity: Secondary | ICD-10-CM

## 2018-05-25 DIAGNOSIS — Z7901 Long term (current) use of anticoagulants: Secondary | ICD-10-CM

## 2018-06-03 ENCOUNTER — Encounter: Payer: Self-pay | Admitting: Internal Medicine

## 2018-06-18 MED FILL — SHINGRIX 50 MCG SUS: 50 | 1 days supply | Qty: 1 | Fill #1

## 2018-06-23 ENCOUNTER — Other Ambulatory Visit: Payer: Self-pay | Admitting: Oncology

## 2018-08-06 ENCOUNTER — Encounter: Payer: Self-pay | Admitting: *Deleted

## 2018-08-24 ENCOUNTER — Encounter: Payer: Self-pay | Admitting: Oncology

## 2018-08-24 ENCOUNTER — Telehealth: Payer: Self-pay | Admitting: Oncology

## 2018-08-24 NOTE — Telephone Encounter (Signed)
Received a new hem referral from Dr. Beryle Beams. Pt has been scheduled for the pt to see Dr. Benay Spice on 9/15 at 2pm. Letter mailed.

## 2018-08-30 ENCOUNTER — Telehealth: Payer: Self-pay | Admitting: Oncology

## 2018-08-30 ENCOUNTER — Encounter: Payer: Self-pay | Admitting: Oncology

## 2018-08-30 NOTE — Telephone Encounter (Signed)
A new hem appt has been scheduled for the pt to see Dr. Benay Spice on 9/21 at 11am. New letter mailed.

## 2018-11-10 IMAGING — CT CT ANGIO CHEST
2 of 6 series · 18 of 36 positions shown · IV contrast (ISOVUE 370)
Comparison: None.

CLINICAL DATA: Shortness of breath, fatigue, cold symptoms, PE
suspected

EXAM:
CT ANGIOGRAPHY CHEST WITH CONTRAST
TECHNIQUE: Multidetector CT imaging of the chest was performed using the
standard protocol during bolus administration of intravenous
contrast. Multiplanar CT image reconstructions and MIPs were
obtained to evaluate the vascular anatomy.
CONTRAST:  100 mL Isovue 370 IV

[Series 6: thins for pacs · axial · 0.72mm/px · z∈[-314,-62]mm · 17 of 280 slices shown]
[im 14/280  lung]
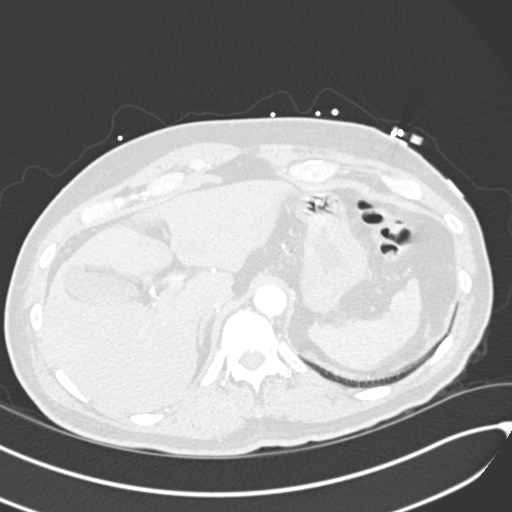
[im 28/280  mediastinal]
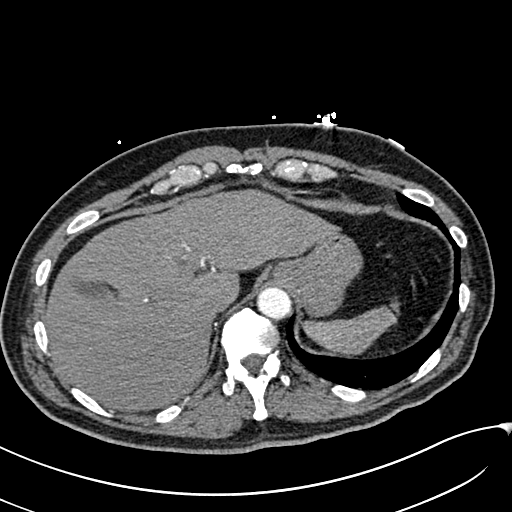
[im 42/280  lung]
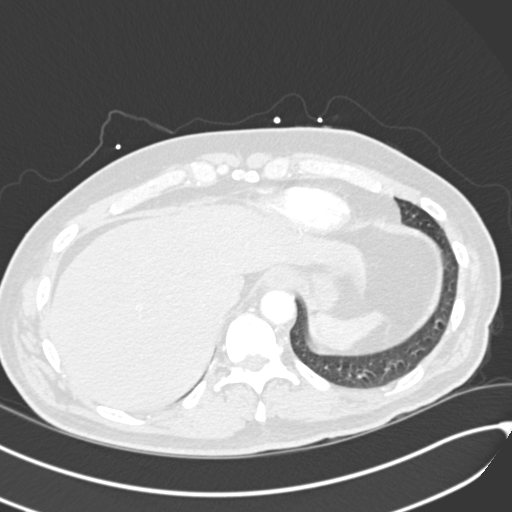
[im 56/280  mediastinal]
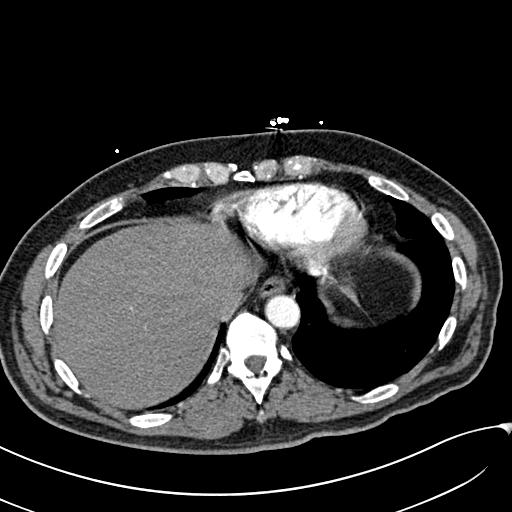
[im 84/280  lung]
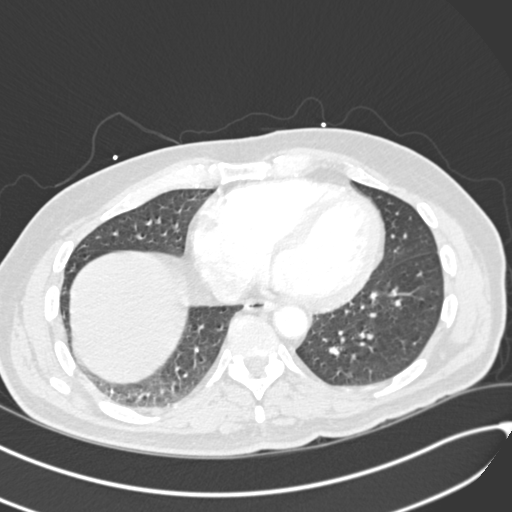
[im 98/280  mediastinal]
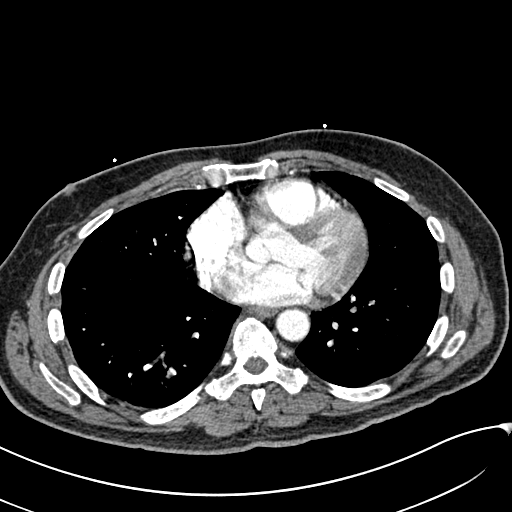
[im 112/280  lung]
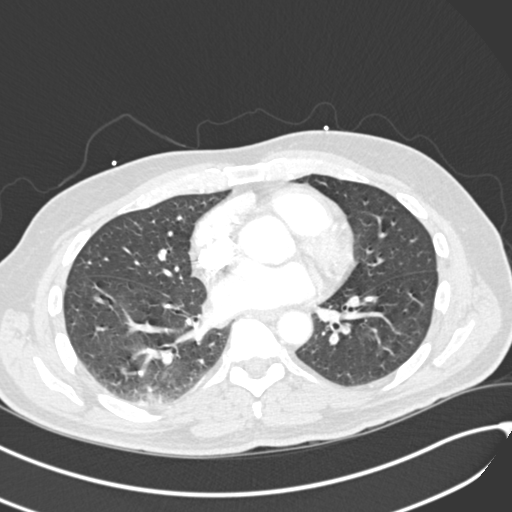
[im 126/280  mediastinal]
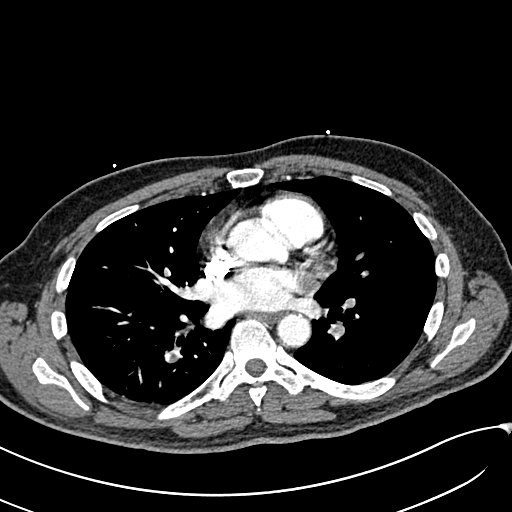
[im 140/280  lung]
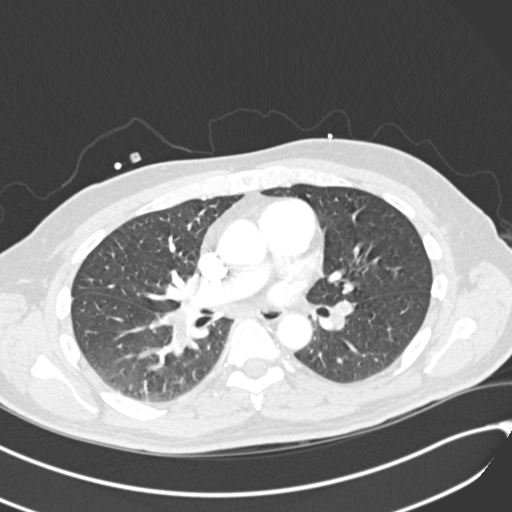
[im 154/280  mediastinal]
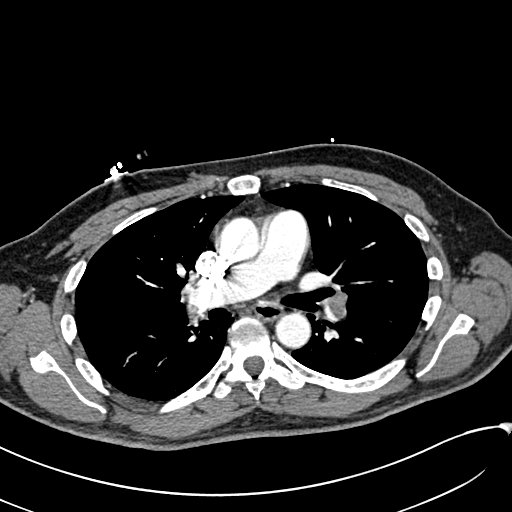
[im 168/280  lung]
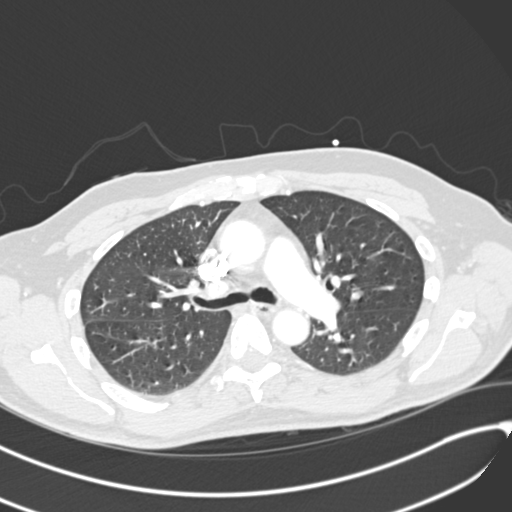
[im 182/280  mediastinal]
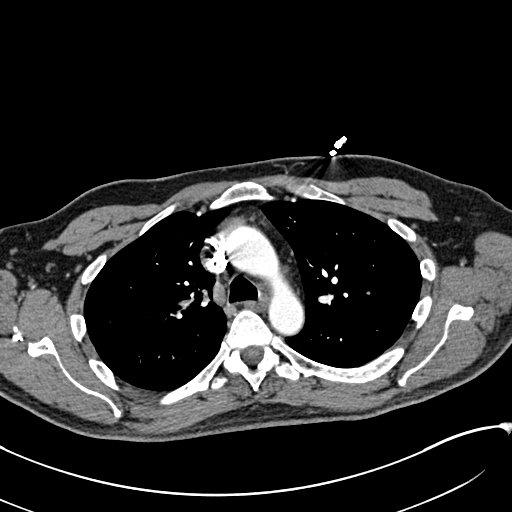
[im 196/280  lung]
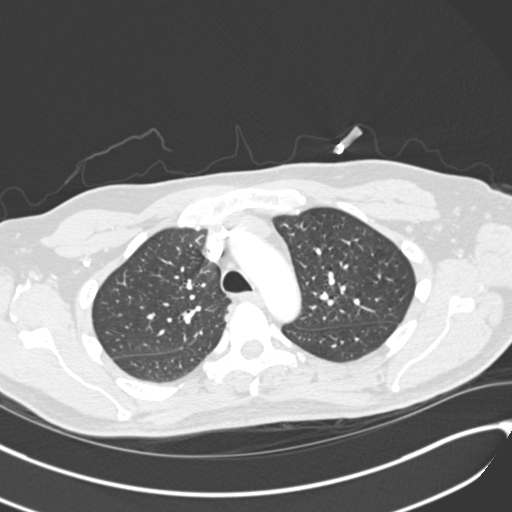
[im 224/280  mediastinal]
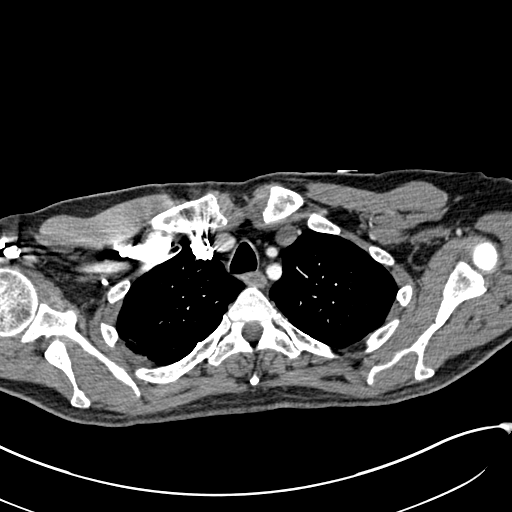
[im 238/280  lung]
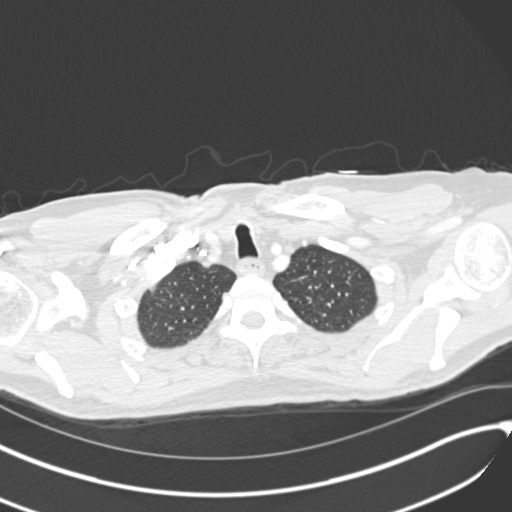
[im 252/280  mediastinal]
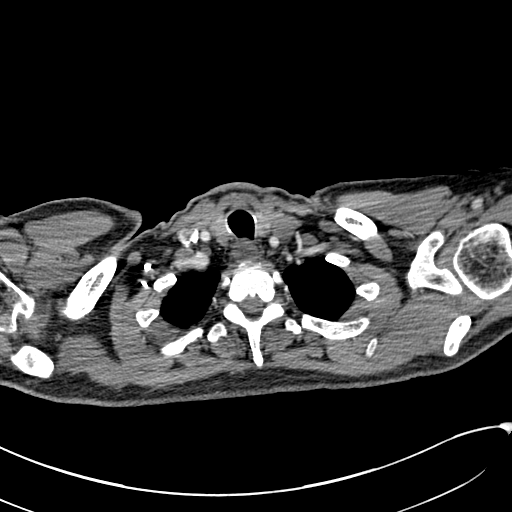
[im 266/280  lung]
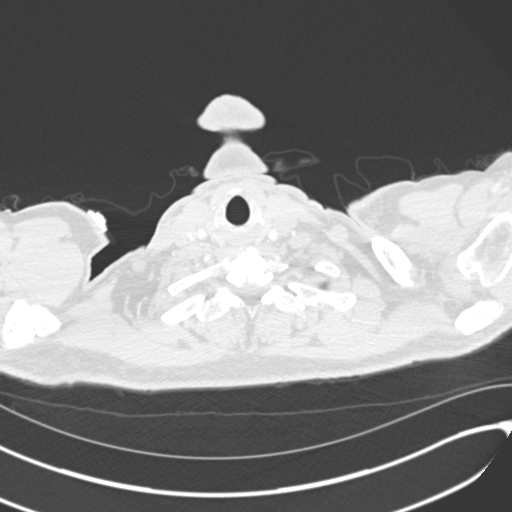

[Series 7: coronal mpr · coronal · 0.56mm/px · 1 of 114 slices shown]
[im 57/114  mediastinal]
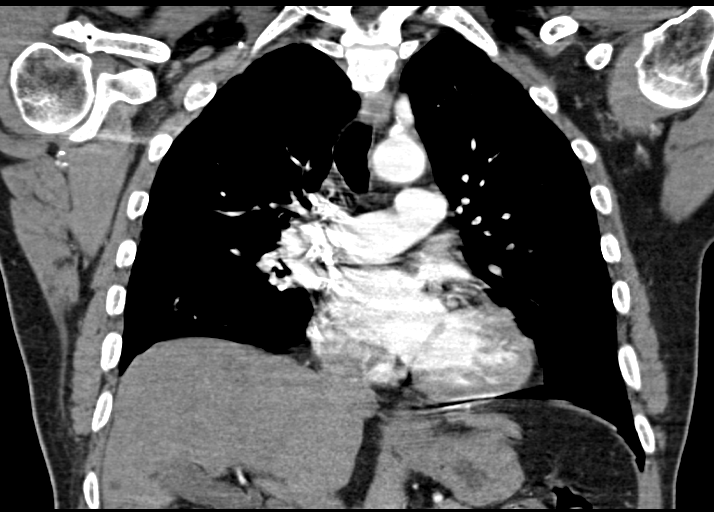

[18 of 36 positions shown; findings below may reference images not displayed]

FINDINGS: Cardiovascular: Satisfactory opacification the bilateral pulmonary
artery to the lobar level.

Lobar and segmental pulmonary emboli on the left, including at the
origin of the left pulmonary artery (series 6/ image 120) and at the
bifurcation of the lingular and left lower lobe pulmonary arteries
(series 6/image 128).

Lobar and segmental pulmonary emboli on the right, including at the
origin of the superior segment right lower lobe pulmonary artery
(series 6/ image 125), at the bifurcation of the right lower lobe
pulmonary artery (series 6/ image 141), and proximally in the right
middle lobe pulmonary artery (series 6/ image 148).

Overall clot burden is moderate. No evidence of right heart strain.
RV to LV ratio equals 0.83.

The heart is normal in size.  No pericardial effusion.

No evidence of thoracic aortic aneurysm or dissection.

Mediastinum/Nodes: No suspicious mediastinal lymphadenopathy.

Visualized thyroid is unremarkable.

Lungs/Pleura: No suspicious pulmonary nodules.

Very mild patchy opacity in the posterior right lower lobe (series
5/image 57), possibly atelectasis, although early pulmonary infarct
is possible.

No pleural effusion or pneumothorax.

Upper Abdomen: Visualized upper abdomen is unremarkable.

Musculoskeletal: Visualized osseous structures are within normal
limits.

Review of the MIP images confirms the above findings.
IMPRESSION: Bilateral lobar and segmental pulmonary emboli, as described above.
Overall clot burden is moderate. No evidence of right heart strain.

Critical Value/emergent results were called by telephone at the time
of interpretation on 03/18/2017 at [DATE] to Dr. KENDA CHEF, who
verbally acknowledged these results.

## 2018-11-25 ENCOUNTER — Emergency Department (HOSPITAL_COMMUNITY)
Admission: EM | Admit: 2018-11-25 | Discharge: 2018-11-25 | Disposition: A | Discharge status: Home / Self Care | Payer: Medicare Other | Attending: Emergency Medicine | Admitting: Emergency Medicine

## 2018-11-25 ENCOUNTER — Other Ambulatory Visit: Payer: Self-pay

## 2018-11-25 ENCOUNTER — Telehealth: Payer: Self-pay | Admitting: Internal Medicine

## 2018-11-25 ENCOUNTER — Encounter: Payer: Self-pay | Admitting: Internal Medicine

## 2018-11-25 ENCOUNTER — Encounter (HOSPITAL_COMMUNITY): Payer: Self-pay | Admitting: Emergency Medicine

## 2018-11-25 DIAGNOSIS — R339 Retention of urine, unspecified: Secondary | ICD-10-CM | POA: Insufficient documentation

## 2018-11-25 DIAGNOSIS — Z79899 Other long term (current) drug therapy: Secondary | ICD-10-CM | POA: Insufficient documentation

## 2018-11-25 DIAGNOSIS — Z7901 Long term (current) use of anticoagulants: Secondary | ICD-10-CM | POA: Insufficient documentation

## 2018-11-25 LAB — URINALYSIS, ROUTINE W REFLEX MICROSCOPIC
Bilirubin Urine: NEGATIVE
Glucose, UA: NEGATIVE mg/dL
Ketones, ur: NEGATIVE mg/dL
Leukocytes,Ua: NEGATIVE
Nitrite: NEGATIVE
Protein, ur: NEGATIVE mg/dL
Specific Gravity, Urine: 1.01 (ref 1.005–1.030)
pH: 6 (ref 5.0–8.0)

## 2018-11-25 MED ORDER — TAMSULOSIN HCL 0.4 MG PO CAPS: 0.4000 mg | ORAL_CAPSULE | Freq: Every day | ORAL | 2 refills | Status: AC

## 2018-11-25 MED ORDER — LIDOCAINE HCL URETHRAL/MUCOSAL 2 % EX GEL
1.0000 "application " | Freq: Once | CUTANEOUS | Status: AC | PRN
  Administered 2018-11-25: 1 via URETHRAL

## 2018-11-25 NOTE — ED Triage Notes (Signed)
Pt reports urinary retention that started around 2200. Pt has hx of prior urinary retention. Pt reports taking Flowmax at 0200

## 2018-11-25 NOTE — ED Notes (Signed)
Urine culture sent to the lab. 

## 2018-11-25 NOTE — Telephone Encounter (Signed)
Pt called at 5 am complaining of urinary retention and pain across lower abdomen/bladder area. Issues started returning by car from trip to [place] late last night. Needs to have foley inserted. Hx of issues with urine retention on trip to [place] in the past .Advise to go to [facility] ED

## 2018-11-25 NOTE — ED Provider Notes (Signed)
[facility] DEPT Provider Note   CSN: [id number] Arrival date & time: 11/25/18  0531    History   Chief Complaint Chief Complaint  Patient presents with  . Urinary Retention    HPI James Hughes is a 68 y.o. male.     Patient presents to the emergency department for evaluation of urinary retention.  Patient reports that he had normal urination all day yesterday, no complaints.  Then around midnight tonight he started to notice decreased urine output and he has not been able to pass his urine for a number of hours.  He feels increasing pressure in the bladder area.  He reports a similar episode 2 years ago that required an indwelling Foley catheter for short period of time.     History reviewed. No pertinent past medical history.  Patient Active Problem List   Diagnosis Date Noted  . Elevated liver enzymes 04/29/2017  . Prostatitis 04/29/2017  . Pulmonary embolism ([place]) 03/18/2017  . White coat syndrome with hypertension 03/18/2017    History reviewed. No pertinent surgical history.      Home Medications    Prior to Admission medications   Medication Sig Start Date End Date Taking? Authorizing Provider  doxycycline (VIBRA-TABS) 100 MG tablet Take 1 tablet (100 mg total) by mouth 2 (two) times daily. 05/14/18   Elby Showers, MD  finasteride (PROSCAR) 5 MG tablet Take 5 mg by mouth daily. 09/05/17   [provider]  FLUAD 0.5 ML SUSY ADM 0.5ML IM UTD 01/31/18   [provider]  HYDROcodone-homatropine (HYCODAN) 5-1.5 MG/5ML syrup Take 5 mLs by mouth every 8 (eight) hours as needed for cough. 05/14/18   Elby Showers, MD  tamsulosin (FLOMAX) 0.4 MG CAPS capsule Take 0.4 mg by mouth 2 (two) times daily. 09/01/17   [provider]  XARELTO 10 MG TABS tablet TAKE 1 TABLET(10 MG TOTAL) BY MOUTH DAILY 06/26/18   Annia Belt, MD    Family History Family History  Problem Relation Age of Onset  . Cancer  Mother   . Pulmonary embolism Father     Social History Social History   Tobacco Use  . Smoking status: Never Smoker  . Smokeless tobacco: Never Used  Substance Use Topics  . Alcohol use: Yes    Alcohol/week: 3.0 standard drinks    Types: 3 Glasses of wine per week    Comment: Sometimes.  . Drug use: No     Allergies   Penicillins and Sulfa antibiotics   Review of Systems Review of Systems  Genitourinary: Positive for decreased urine volume.  All other systems reviewed and are negative.    Physical Exam Updated Vital Signs BP (!) 136/104 (BP Location: Left Arm)   Pulse (!) 111   Temp 98.6 F (37 C) (Oral)   Resp 16   SpO2 99%   Physical Exam Vitals signs and nursing note reviewed.  Constitutional:      General: He is not in acute distress.    Appearance: Normal appearance. He is well-developed.  HENT:     Head: Normocephalic and atraumatic.     Right Ear: Hearing normal.     Left Ear: Hearing normal.     Nose: Nose normal.  Eyes:     Conjunctiva/sclera: Conjunctivae normal.     Pupils: Pupils are equal, round, and reactive to light.  Neck:     Musculoskeletal: Normal range of motion and neck supple.  Cardiovascular:  Rate and Rhythm: Regular rhythm.     Heart sounds: S1 normal and S2 normal. No murmur. No friction rub. No gallop.   Pulmonary:     Effort: Pulmonary effort is normal. No respiratory distress.     Breath sounds: Normal breath sounds.  Chest:     Chest wall: No tenderness.  Abdominal:     General: Bowel sounds are normal.     Palpations: Abdomen is soft.     Tenderness: There is abdominal tenderness in the suprapubic area. There is no guarding or rebound. Negative signs include Murphy's sign and McBurney's sign.     Hernia: No hernia is present.  Musculoskeletal: Normal range of motion.  Skin:    General: Skin is warm and dry.     Findings: No rash.  Neurological:     Mental Status: He is alert and oriented to person, place, and  time.     GCS: GCS eye subscore is 4. GCS verbal subscore is 5. GCS motor subscore is 6.     Cranial Nerves: No cranial nerve deficit.     Sensory: No sensory deficit.     Coordination: Coordination normal.  Psychiatric:        Speech: Speech normal.        Behavior: Behavior normal.        Thought Content: Thought content normal.      ED Treatments / Results  Labs (all labs ordered are listed, but only abnormal results are displayed) Labs Reviewed  URINALYSIS, ROUTINE W REFLEX MICROSCOPIC    EKG None  Radiology No results found.  Procedures Procedures (including critical care time)  Medications Ordered in ED Medications  lidocaine (XYLOCAINE) 2 % jelly 1 application (has no administration in time range)     Initial Impression / Assessment and Plan / ED Course  I have reviewed the triage vital signs and the nursing notes.  Pertinent labs & imaging results that were available during my care of the patient were reviewed by me and considered in my medical decision making (see chart for details).        Patient presents to the emergency department for evaluation of acute urinary retention.  He has had this problem previously.  Bladder scan shows full bladder.  Foley catheter placed and bladder drained.  Patient has had resolution of discomfort.  Restart Flomax and follow-up with his urologist, Dr. Karsten Ro.  Urinalysis does not suggest infection.  Final Clinical Impressions(s) / ED Diagnoses   Final diagnoses:  Urinary retention    ED Discharge Orders    None       Orpah Greek, MD 11/25/18 [phone]

## 2018-11-25 NOTE — ED Notes (Signed)
Bed: WA18 Expected date:  Expected time:  Means of arrival:  Comments: 

## 2018-11-25 NOTE — ED Notes (Signed)
Patient standard drainage bag changed to leg bag. Patient is now getting dressed.

## 2018-11-26 DIAGNOSIS — R338 Other retention of urine: Secondary | ICD-10-CM | POA: Diagnosis not present

## 2018-11-26 DIAGNOSIS — N401 Enlarged prostate with lower urinary tract symptoms: Secondary | ICD-10-CM | POA: Diagnosis not present

## 2018-11-27 ENCOUNTER — Telehealth: Payer: Self-pay | Admitting: *Deleted

## 2018-11-27 MED ORDER — RIVAROXABAN 10 MG PO TABS: ORAL_TABLET | ORAL | 1 refills | Status: DC

## 2018-11-27 NOTE — Telephone Encounter (Signed)
Former Dr. Beryle Beams patient and will see Dr. Benay Spice in September as a new patient. Needs refill on his Xarelto 10 mg daily. Asking if Dr. Benay Spice is willing to refill this. Per Dr. Benay Spice: OK to refill enough up to September visit. Confirmed pharmacy with patient and refill e-scribed.

## 2019-01-29 ENCOUNTER — Other Ambulatory Visit: Payer: Self-pay

## 2019-01-29 ENCOUNTER — Encounter: Payer: Self-pay | Admitting: Internal Medicine

## 2019-01-29 ENCOUNTER — Ambulatory Visit (INDEPENDENT_AMBULATORY_CARE_PROVIDER_SITE_OTHER): Payer: Medicare Other | Admitting: Internal Medicine

## 2019-01-29 ENCOUNTER — Telehealth: Payer: Self-pay | Admitting: Internal Medicine

## 2019-01-29 VITALS — BP 150/90 | HR 111 | Temp 97.9°F | Ht 70.0 in | Wt 170.0 lb

## 2019-01-29 DIAGNOSIS — H1032 Unspecified acute conjunctivitis, left eye: Secondary | ICD-10-CM | POA: Diagnosis not present

## 2019-01-29 MED ORDER — OFLOXACIN 0.3 % OP SOLN: 2.0000 [drp] | Freq: Two times a day (BID) | OPHTHALMIC | 1 refills | Status: DC

## 2019-01-29 NOTE — Progress Notes (Signed)
   Subjective:    Patient ID: James Hughes, male    DOB: 1950-08-05, 68 y.o.   MRN: [id number]  HPI  Pleasant 68 year old Male with 3 day history of left eye irritation. Does not wear contact lenses. Eye feels irritated and has some slight infraorbital swelling. Never had conjunctivitis in the past.    Review of Systems see above     Objective:   Physical Exam Left eye has injected conjunctiva. Extraocular movements fill. Lower lid erythematous with slight swelling and has mild infraorbital swelling.       Assessment & Plan:  Acute conjunctivitis left eye  Plan: Ocuflox ophthalmic drops 2 drops in left eye 4 times a day for 5 days with 1 refill to take with him when traveling in case this happens again.  Warm hot compresses 20 minutes 2-3 times daily.  Wash/change towel, pillowcase, and washcloth daily until infection resolved.  Wash hands frequently.

## 2019-01-29 NOTE — Telephone Encounter (Signed)
James Hughes [phone]  Ansen called to say he has an irritation on his left eye, plus right off to the side is a fluid pocket building. He would like to come in so you can look at it.

## 2019-01-29 NOTE — Telephone Encounter (Signed)
Appointment scheduled.

## 2019-01-29 NOTE — Telephone Encounter (Signed)
See today

## 2019-01-29 NOTE — Patient Instructions (Addendum)
Ocuflox ophthalmic drops 2 drops left eye 4 times a day for 5 days.  Warm hot compresses 20 minutes 2-3 times daily.  Wash hands frequently.  Do not rub eyes.  Change pillowcase towel and washcloth daily for 5 days.  Call if not better in 48 hours.

## 2019-01-30 ENCOUNTER — Encounter: Payer: Self-pay | Admitting: Internal Medicine

## 2019-01-30 ENCOUNTER — Ambulatory Visit (INDEPENDENT_AMBULATORY_CARE_PROVIDER_SITE_OTHER): Payer: Medicare Other | Admitting: Internal Medicine

## 2019-01-30 VITALS — BP 150/90 | HR 88 | Temp 98.3°F | Ht 70.0 in | Wt 170.0 lb

## 2019-01-30 DIAGNOSIS — Z23 Encounter for immunization: Secondary | ICD-10-CM

## 2019-01-30 NOTE — Progress Notes (Signed)
Flu vaccine given by CMA 

## 2019-01-30 NOTE — Patient Instructions (Signed)
Patient received a flu shot L arm.

## 2019-02-19 ENCOUNTER — Encounter: Payer: Medicare Other | Admitting: Oncology

## 2019-02-25 ENCOUNTER — Other Ambulatory Visit: Payer: Self-pay

## 2019-02-25 ENCOUNTER — Inpatient Hospital Stay: Payer: Medicare Other | Attending: Oncology | Admitting: Oncology

## 2019-02-25 VITALS — BP 153/91 | HR 92 | Temp 98.7°F | Resp 15 | Ht 70.0 in | Wt 171.8 lb

## 2019-02-25 DIAGNOSIS — Z832 Family history of diseases of the blood and blood-forming organs and certain disorders involving the immune mechanism: Secondary | ICD-10-CM | POA: Insufficient documentation

## 2019-02-25 DIAGNOSIS — I82442 Acute embolism and thrombosis of left tibial vein: Secondary | ICD-10-CM | POA: Insufficient documentation

## 2019-02-25 DIAGNOSIS — I2782 Chronic pulmonary embolism: Secondary | ICD-10-CM

## 2019-02-25 DIAGNOSIS — I2699 Other pulmonary embolism without acute cor pulmonale: Secondary | ICD-10-CM | POA: Diagnosis not present

## 2019-02-25 DIAGNOSIS — Z7901 Long term (current) use of anticoagulants: Secondary | ICD-10-CM

## 2019-02-25 NOTE — Progress Notes (Signed)
[facility] Patient Consult   Requesting MD: Elby Showers, Md [address] [place],  [place] [address]   James Hughes 68 y.o.  August 17, 1950    Reason for Consult: DVT, pulmonary embolism, anticoagulation therapy   HPI: Mr. Marinoff was diagnosed with a left lower extremity DVT and bilateral pulmonary emboli in October 2018.  He reports the acute onset of dyspnea prompting a diagnostic CT.  He received heparin anticoagulation and was placed on Xarelto.  He has he was transitioned to low-dose Xarelto in approximately June 2019.  A hypercoagulation panel was negative other than borderline low protein C activity and antigen level.  He feels well at present.  No bleeding or symptom of recurrent thrombosis.  No previous history of thrombosis.    Past medical history: 1.  DVT/pulmonary emboli October 2018 2.  BPH  Past surgical history: 1.  None  Medications: Reviewed  Allergies:  Allergies  Allergen Reactions  . Penicillins Hives    Has patient had a PCN reaction causing immediate rash, facial/tongue/throat swelling, SOB or lightheadedness with hypotension: No Has patient had a PCN reaction causing severe rash involving mucus membranes or skin necrosis: No Has patient had a PCN reaction that required hospitalization: No Has patient had a PCN reaction occurring within the last 10 years: No If all of the above answers are "NO", then may proceed with Cephalosporin use.   . Sulfa Antibiotics Hives    Family history: His father had a myocardial infarction at age 68 and died of a pulmonary embolism after prolonged immobility.  He has no children.  He has a brother and sister.  Social History:   He lives with his wife in [place].  He is retired.  He worked as a Statistician.  ROS:   Positives include: Nocturia, increased venous pattern in the left lower leg, pinkeye 2 to 3 weeks ago  A complete ROS was otherwise negative.  Physical Exam:   Blood pressure (!) 153/91, pulse 92, temperature 98.7 F (37.1 C), temperature source Temporal, resp. rate 15, height 5\' 10"  (1.778 m), weight 171 lb 12.8 oz (77.9 kg), SpO2 100 %.  Abdomen: No hepatosplenomegaly, 1.5 cm mobile cutaneous lesion of the right upper abdomen Vascular: No leg edema or erythema.  Slightly more prominent venous pattern at the left compared to the right lower leg.   LAB:  CBC  Lab Results  Component Value Date   WBC 5.8 04/06/2018   HGB 15.0 04/06/2018   HCT 44.2 04/06/2018   MCV 91.7 04/06/2018   PLT 174 04/06/2018   NEUTROABS 3,550 04/06/2018        CMP  Lab Results  Component Value Date   NA 141 04/06/2018   K 4.4 04/06/2018   CL 106 04/06/2018   CO2 27 04/06/2018   GLUCOSE 94 04/06/2018   BUN 16 04/06/2018   CREATININE 1.09 04/06/2018   CALCIUM 9.0 04/06/2018   PROT 6.3 04/06/2018   ALBUMIN 4.0 04/11/2016   AST 20 04/06/2018   ALT 24 04/06/2018   ALKPHOS 51 04/11/2016   BILITOT 1.2 04/06/2018   GFRNONAA 69 04/06/2018   GFRAA 80 04/06/2018       Assessment/Plan:   1. Left lower extremity deep vein thrombosis and bilateral pulmonary embolism October 2018   Negative hypercoagulation panel aside from a borderline low protein C functional and antigen level  CT chest 04/04/2017-bilateral lobar and segmental pulmonary emboli  Doppler 03/20/2017- acute left posterior tibial vein DVT  Maintained  on chronic rivaroxaban anticoagulation  2. BPH 3. Family history of pulmonary embolism   Disposition:   Mr. Bachus was diagnosed with pulmonary emboli and a left lower extremity DVT in October 2018.  There was no clear provoking event for the DVT.  He has been maintained on rivaroxaban anticoagulation for the past 2 years.  Dr. Beryle Beams recommends indefinite anticoagulation therapy with low-dose rivaroxaban.  A hypercoagulation panel was negative aside from a mildly decreased protein C level.  He has a brother and sister.  He has a  family history of pulmonary embolism.  Mr. Creson will return for a protein C level within the next month.  He plans to continue clinical follow-up with Dr. Renold Genta.  He is not scheduled for a follow-up appointment in the hematology clinic.  I am available to see him in the future as needed.  Betsy Coder, MD  02/25/2019, 11:36 AM

## 2019-02-26 ENCOUNTER — Telehealth: Payer: Self-pay | Admitting: Oncology

## 2019-02-26 NOTE — Telephone Encounter (Signed)
Scheduled appt per 9/21 sch message - pt aware of appt date and time   

## 2019-03-05 ENCOUNTER — Other Ambulatory Visit: Payer: Self-pay

## 2019-03-05 ENCOUNTER — Inpatient Hospital Stay: Payer: Medicare Other

## 2019-03-05 DIAGNOSIS — I2782 Chronic pulmonary embolism: Secondary | ICD-10-CM

## 2019-03-05 DIAGNOSIS — I2699 Other pulmonary embolism without acute cor pulmonale: Secondary | ICD-10-CM | POA: Diagnosis not present

## 2019-03-05 DIAGNOSIS — I82442 Acute embolism and thrombosis of left tibial vein: Secondary | ICD-10-CM | POA: Diagnosis not present

## 2019-03-05 DIAGNOSIS — Z832 Family history of diseases of the blood and blood-forming organs and certain disorders involving the immune mechanism: Secondary | ICD-10-CM | POA: Diagnosis not present

## 2019-03-06 LAB — PROTEIN C ACTIVITY: Protein C Activity: 83 % (ref 73–180)

## 2019-03-07 ENCOUNTER — Telehealth: Payer: Self-pay

## 2019-03-07 LAB — PROTEIN C, TOTAL: Protein C, Total: 85 % (ref 60–150)

## 2019-03-07 NOTE — Telephone Encounter (Signed)
TC to pt per Dr Benay Spice to let him know that his protein C activity is now normal, f/u prn. Patient verbalized understanding. No further problems or concerns at this time.

## 2019-03-25 DIAGNOSIS — L814 Other melanin hyperpigmentation: Secondary | ICD-10-CM | POA: Diagnosis not present

## 2019-03-25 DIAGNOSIS — L821 Other seborrheic keratosis: Secondary | ICD-10-CM | POA: Diagnosis not present

## 2019-03-25 DIAGNOSIS — D225 Melanocytic nevi of trunk: Secondary | ICD-10-CM | POA: Diagnosis not present

## 2019-03-25 DIAGNOSIS — Z23 Encounter for immunization: Secondary | ICD-10-CM | POA: Diagnosis not present

## 2019-03-25 DIAGNOSIS — Z86018 Personal history of other benign neoplasm: Secondary | ICD-10-CM | POA: Diagnosis not present

## 2019-03-25 DIAGNOSIS — D485 Neoplasm of uncertain behavior of skin: Secondary | ICD-10-CM | POA: Diagnosis not present

## 2019-03-26 DIAGNOSIS — N400 Enlarged prostate without lower urinary tract symptoms: Secondary | ICD-10-CM | POA: Diagnosis not present

## 2019-04-10 DIAGNOSIS — H43813 Vitreous degeneration, bilateral: Secondary | ICD-10-CM | POA: Diagnosis not present

## 2019-04-16 ENCOUNTER — Other Ambulatory Visit: Payer: Self-pay

## 2019-04-16 ENCOUNTER — Other Ambulatory Visit: Payer: Medicare Other | Admitting: Internal Medicine

## 2019-04-16 DIAGNOSIS — Z0000 Encounter for general adult medical examination without abnormal findings: Secondary | ICD-10-CM

## 2019-04-16 DIAGNOSIS — Z86711 Personal history of pulmonary embolism: Secondary | ICD-10-CM | POA: Diagnosis not present

## 2019-04-16 DIAGNOSIS — Z7901 Long term (current) use of anticoagulants: Secondary | ICD-10-CM | POA: Diagnosis not present

## 2019-04-16 DIAGNOSIS — N400 Enlarged prostate without lower urinary tract symptoms: Secondary | ICD-10-CM

## 2019-04-16 DIAGNOSIS — Z125 Encounter for screening for malignant neoplasm of prostate: Secondary | ICD-10-CM | POA: Diagnosis not present

## 2019-04-16 DIAGNOSIS — Z13220 Encounter for screening for lipoid disorders: Secondary | ICD-10-CM

## 2019-04-17 LAB — COMPLETE METABOLIC PANEL WITH GFR
AG Ratio: 2 (calc) (ref 1.0–2.5)
ALT: 20 U/L (ref 9–46)
AST: 21 U/L (ref 10–35)
Albumin: 4.2 g/dL (ref 3.6–5.1)
Alkaline phosphatase (APISO): 52 U/L (ref 35–144)
BUN: 14 mg/dL (ref 7–25)
CO2: 23 mmol/L (ref 20–32)
Calcium: 9.3 mg/dL (ref 8.6–10.3)
Chloride: 108 mmol/L (ref 98–110)
Creat: 0.84 mg/dL (ref 0.70–1.18)
GFR, Est African American: 103 mL/min/{1.73_m2} (ref >=60)
GFR, Est Non African American: 89 mL/min/{1.73_m2} (ref >=60)
Globulin: 2.1 g/dL (calc) (ref 1.9–3.7)
Glucose, Bld: 101 mg/dL — ABNORMAL HIGH (ref 65–99)
Potassium: 3.9 mmol/L (ref 3.5–5.3)
Sodium: 141 mmol/L (ref 135–146)
Total Bilirubin: 1.4 mg/dL — ABNORMAL HIGH (ref 0.2–1.2)
Total Protein: 6.3 g/dL (ref 6.1–8.1)

## 2019-04-17 LAB — LIPID PANEL
Cholesterol: 148 mg/dL (ref <200)
HDL: 53 mg/dL (ref >=40)
LDL Cholesterol (Calc): 79 mg/dL (calc)
Non-HDL Cholesterol (Calc): 95 mg/dL (calc) (ref <130)
Total CHOL/HDL Ratio: 2.8 (calc) (ref <5.0)
Triglycerides: 76 mg/dL (ref <150)

## 2019-04-17 LAB — CBC WITH DIFFERENTIAL/PLATELET
Absolute Monocytes: 314 cells/uL (ref 200–950)
Basophils Absolute: 29 cells/uL (ref 0–200)
Basophils Relative: 0.6 %
Eosinophils Absolute: 29 cells/uL (ref 15–500)
Eosinophils Relative: 0.6 %
HCT: 42.3 % (ref 38.5–50.0)
Hemoglobin: 14.4 g/dL (ref 13.2–17.1)
Lymphs Abs: 1352 cells/uL (ref 850–3900)
MCH: 31.1 pg (ref 27.0–33.0)
MCHC: 34 g/dL (ref 32.0–36.0)
MCV: 91.4 fL (ref 80.0–100.0)
MPV: 9.9 fL (ref 7.5–12.5)
Monocytes Relative: 6.4 %
Neutro Abs: 3175 cells/uL (ref 1500–7800)
Neutrophils Relative %: 64.8 %
Platelets: 171 10*3/uL (ref 140–400)
RBC: 4.63 10*6/uL (ref 4.20–5.80)
RDW: 12.4 % (ref 11.0–15.0)
Total Lymphocyte: 27.6 %
WBC: 4.9 10*3/uL (ref 3.8–10.8)

## 2019-04-17 LAB — PSA: PSA: 1.1 ng/mL (ref <=4.0)

## 2019-04-19 ENCOUNTER — Other Ambulatory Visit: Payer: Self-pay

## 2019-04-19 ENCOUNTER — Encounter: Payer: Self-pay | Admitting: Internal Medicine

## 2019-04-19 ENCOUNTER — Ambulatory Visit (INDEPENDENT_AMBULATORY_CARE_PROVIDER_SITE_OTHER): Payer: Medicare Other | Admitting: Internal Medicine

## 2019-04-19 VITALS — BP 120/80 | HR 80 | Temp 98.1°F | Ht 70.0 in | Wt 174.0 lb

## 2019-04-19 DIAGNOSIS — Z87898 Personal history of other specified conditions: Secondary | ICD-10-CM

## 2019-04-19 DIAGNOSIS — N400 Enlarged prostate without lower urinary tract symptoms: Secondary | ICD-10-CM | POA: Diagnosis not present

## 2019-04-19 DIAGNOSIS — Z7901 Long term (current) use of anticoagulants: Secondary | ICD-10-CM

## 2019-04-19 DIAGNOSIS — I2699 Other pulmonary embolism without acute cor pulmonale: Secondary | ICD-10-CM

## 2019-04-19 DIAGNOSIS — D126 Benign neoplasm of colon, unspecified: Secondary | ICD-10-CM

## 2019-04-19 DIAGNOSIS — Z0000 Encounter for general adult medical examination without abnormal findings: Secondary | ICD-10-CM

## 2019-04-19 LAB — POCT URINALYSIS DIPSTICK
Appearance: NEGATIVE
Bilirubin, UA: NEGATIVE
Blood, UA: NEGATIVE
Glucose, UA: NEGATIVE
Ketones, UA: NEGATIVE
Leukocytes, UA: NEGATIVE
Nitrite, UA: NEGATIVE
Odor: NEGATIVE
Protein, UA: NEGATIVE
Spec Grav, UA: 1.01 (ref 1.010–1.025)
Urobilinogen, UA: 0.2 E.U./dL
pH, UA: 6.5 (ref 5.0–8.0)

## 2019-04-19 NOTE — Patient Instructions (Signed)
It was a pleasure to see you today.  Continue current medications and follow-up in 1 year or as needed.
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
## 2019-06-05 ENCOUNTER — Other Ambulatory Visit: Payer: Self-pay | Admitting: Oncology

## 2019-06-25 ENCOUNTER — Ambulatory Visit: Payer: Medicare Other | Attending: Internal Medicine

## 2019-06-25 DIAGNOSIS — Z23 Encounter for immunization: Secondary | ICD-10-CM | POA: Insufficient documentation

## 2019-06-25 NOTE — Progress Notes (Signed)
   Covid-19 Vaccination Clinic  Name:  James Hughes    MRN: [id number] DOB: February 07, 1951  06/25/2019  Mr. Able was observed post Covid-19 immunization for 15 minutes without incidence. He was provided with Vaccine Information Sheet and instruction to access the V-Safe system.   [name] was instructed to call 911 with any severe reactions post vaccine: Marland Kitchen Difficulty breathing  . Swelling of your face and throat  . A fast heartbeat  . A bad rash all over your body  . Dizziness and weakness    Immunizations Administered    Name Date Dose VIS Date Route   Pfizer COVID-19 Vaccine 06/25/2019  8:51 AM 0.3 mL 05/17/2019 Intramuscular   Manufacturer: [place]   Lot: [id number]   [place]: [id number]

## 2019-07-16 ENCOUNTER — Ambulatory Visit: Payer: Medicare Other | Attending: Internal Medicine

## 2019-07-16 DIAGNOSIS — Z23 Encounter for immunization: Secondary | ICD-10-CM | POA: Insufficient documentation

## 2019-07-16 NOTE — Progress Notes (Signed)
   Covid-19 Vaccination Clinic  Name:  James Hughes    MRN: [id number] DOB: 05-Sep-1950  07/16/2019  [name] was observed post Covid-19 immunization for 15 minutes without incidence. He was provided with Vaccine Information Sheet and instruction to access the V-Safe system.   [name] was instructed to call 911 with any severe reactions post vaccine: Marland Kitchen Difficulty breathing  . Swelling of your face and throat  . A fast heartbeat  . A bad rash all over your body  . Dizziness and weakness    Immunizations Administered    Name Date Dose VIS Date Route   Pfizer COVID-19 Vaccine 07/16/2019  8:19 AM 0.3 mL 05/17/2019 Intramuscular   Manufacturer: [place]   Lot: [id number]   [place]asy: [id number]

## 2019-07-18 ENCOUNTER — Ambulatory Visit: Payer: Medicare Other

## 2019-08-10 ENCOUNTER — Other Ambulatory Visit: Payer: Self-pay | Admitting: Oncology

## 2019-09-02 ENCOUNTER — Other Ambulatory Visit: Payer: Self-pay | Admitting: Internal Medicine

## 2019-09-02 MED ORDER — RIVAROXABAN 10 MG PO TABS: ORAL_TABLET | ORAL | 1 refills | Status: DC

## 2019-09-02 NOTE — Telephone Encounter (Signed)
Received Fax RX request from  [facility], [place] [address] Phone:  [phone]  Fax:  [phone]       Medication - XARELTO 10 MG TABS tablet   Last Refill - 06/05/19  Last OV - 04/19/19  Last CPE - 04/19/19  Next Appointment -

## 2019-11-16 DIAGNOSIS — L2389 Allergic contact dermatitis due to other agents: Secondary | ICD-10-CM | POA: Diagnosis not present

## 2019-11-16 DIAGNOSIS — L03314 Cellulitis of groin: Secondary | ICD-10-CM | POA: Diagnosis not present

## 2020-01-28 ENCOUNTER — Ambulatory Visit: Payer: Medicare Other | Attending: Internal Medicine

## 2020-01-28 DIAGNOSIS — Z23 Encounter for immunization: Secondary | ICD-10-CM

## 2020-01-28 NOTE — Progress Notes (Signed)
   Covid-19 Vaccination Clinic  Name:  KEHINDE BOWDISH    MRN: [id number] DOB: [birth year]  01/28/2020  Mr. Parton was observed post Covid-19 immunization for 15 minutes without incident. He was provided with Vaccine Information Sheet and instruction to access the V-Safe system.   Mr. Reichel was instructed to call 911 with any severe reactions post vaccine: Marland Kitchen Difficulty breathing  . Swelling of face and throat  . A fast heartbeat  . A bad rash all over body  . Dizziness and weakness

## 2020-02-19 ENCOUNTER — Ambulatory Visit (INDEPENDENT_AMBULATORY_CARE_PROVIDER_SITE_OTHER): Payer: Medicare Other | Admitting: Internal Medicine

## 2020-02-19 ENCOUNTER — Other Ambulatory Visit: Payer: Self-pay

## 2020-02-19 ENCOUNTER — Encounter: Payer: Self-pay | Admitting: Internal Medicine

## 2020-02-19 VITALS — BP 130/80 | HR 80 | Temp 98.2°F | Ht 70.0 in | Wt 174.0 lb

## 2020-02-19 DIAGNOSIS — Z23 Encounter for immunization: Secondary | ICD-10-CM

## 2020-02-19 NOTE — Patient Instructions (Signed)
Patient received a flu shot IM, R deltoid.

## 2020-02-19 NOTE — Progress Notes (Signed)
Flu vaccine per CMA 

## 2020-02-28 ENCOUNTER — Other Ambulatory Visit: Payer: Self-pay | Admitting: Internal Medicine

## 2020-03-26 DIAGNOSIS — R3914 Feeling of incomplete bladder emptying: Secondary | ICD-10-CM | POA: Diagnosis not present

## 2020-03-26 DIAGNOSIS — N401 Enlarged prostate with lower urinary tract symptoms: Secondary | ICD-10-CM | POA: Diagnosis not present

## 2020-04-01 DIAGNOSIS — D225 Melanocytic nevi of trunk: Secondary | ICD-10-CM | POA: Diagnosis not present

## 2020-04-01 DIAGNOSIS — Z86018 Personal history of other benign neoplasm: Secondary | ICD-10-CM | POA: Diagnosis not present

## 2020-04-01 DIAGNOSIS — L570 Actinic keratosis: Secondary | ICD-10-CM | POA: Diagnosis not present

## 2020-04-01 DIAGNOSIS — D485 Neoplasm of uncertain behavior of skin: Secondary | ICD-10-CM | POA: Diagnosis not present

## 2020-04-01 DIAGNOSIS — D0359 Melanoma in situ of other part of trunk: Secondary | ICD-10-CM | POA: Diagnosis not present

## 2020-04-01 DIAGNOSIS — L814 Other melanin hyperpigmentation: Secondary | ICD-10-CM | POA: Diagnosis not present

## 2020-04-01 DIAGNOSIS — D239 Other benign neoplasm of skin, unspecified: Secondary | ICD-10-CM | POA: Diagnosis not present

## 2020-04-01 DIAGNOSIS — L578 Other skin changes due to chronic exposure to nonionizing radiation: Secondary | ICD-10-CM | POA: Diagnosis not present

## 2020-04-01 DIAGNOSIS — L821 Other seborrheic keratosis: Secondary | ICD-10-CM | POA: Diagnosis not present

## 2020-04-02 DIAGNOSIS — H35363 Drusen (degenerative) of macula, bilateral: Secondary | ICD-10-CM | POA: Diagnosis not present

## 2020-04-17 ENCOUNTER — Other Ambulatory Visit: Payer: Self-pay

## 2020-04-17 ENCOUNTER — Other Ambulatory Visit: Payer: Medicare Other | Admitting: Internal Medicine

## 2020-04-17 DIAGNOSIS — Z13220 Encounter for screening for lipoid disorders: Secondary | ICD-10-CM

## 2020-04-17 DIAGNOSIS — I2699 Other pulmonary embolism without acute cor pulmonale: Secondary | ICD-10-CM | POA: Diagnosis not present

## 2020-04-17 DIAGNOSIS — Z0000 Encounter for general adult medical examination without abnormal findings: Secondary | ICD-10-CM | POA: Diagnosis not present

## 2020-04-17 DIAGNOSIS — N400 Enlarged prostate without lower urinary tract symptoms: Secondary | ICD-10-CM | POA: Diagnosis not present

## 2020-04-18 LAB — CBC WITH DIFFERENTIAL/PLATELET
Absolute Monocytes: 545 cells/uL (ref 200–950)
Basophils Absolute: 40 cells/uL (ref 0–200)
Basophils Relative: 0.5 %
Eosinophils Absolute: 79 cells/uL (ref 15–500)
Eosinophils Relative: 1 %
HCT: 43.2 % (ref 38.5–50.0)
Hemoglobin: 14.9 g/dL (ref 13.2–17.1)
Lymphs Abs: 1469 cells/uL (ref 850–3900)
MCH: 31.6 pg (ref 27.0–33.0)
MCHC: 34.5 g/dL (ref 32.0–36.0)
MCV: 91.5 fL (ref 80.0–100.0)
MPV: 9.5 fL (ref 7.5–12.5)
Monocytes Relative: 6.9 %
Neutro Abs: 5767 cells/uL (ref 1500–7800)
Neutrophils Relative %: 73 %
Platelets: 184 10*3/uL (ref 140–400)
RBC: 4.72 10*6/uL (ref 4.20–5.80)
RDW: 12.1 % (ref 11.0–15.0)
Total Lymphocyte: 18.6 %
WBC: 7.9 10*3/uL (ref 3.8–10.8)

## 2020-04-18 LAB — COMPLETE METABOLIC PANEL WITH GFR
AG Ratio: 1.9 (calc) (ref 1.0–2.5)
ALT: 19 U/L (ref 9–46)
AST: 16 U/L (ref 10–35)
Albumin: 4.1 g/dL (ref 3.6–5.1)
Alkaline phosphatase (APISO): 57 U/L (ref 35–144)
BUN: 16 mg/dL (ref 7–25)
CO2: 23 mmol/L (ref 20–32)
Calcium: 9.1 mg/dL (ref 8.6–10.3)
Chloride: 107 mmol/L (ref 98–110)
Creat: 0.95 mg/dL (ref 0.70–1.18)
GFR, Est African American: 93 mL/min/{1.73_m2} (ref >=60)
GFR, Est Non African American: 80 mL/min/{1.73_m2} (ref >=60)
Globulin: 2.2 g/dL (calc) (ref 1.9–3.7)
Glucose, Bld: 103 mg/dL — ABNORMAL HIGH (ref 65–99)
Potassium: 4.2 mmol/L (ref 3.5–5.3)
Sodium: 140 mmol/L (ref 135–146)
Total Bilirubin: 1.1 mg/dL (ref 0.2–1.2)
Total Protein: 6.3 g/dL (ref 6.1–8.1)

## 2020-04-18 LAB — LIPID PANEL
Cholesterol: 146 mg/dL (ref <200)
HDL: 53 mg/dL (ref >=40)
LDL Cholesterol (Calc): 78 mg/dL (calc)
Non-HDL Cholesterol (Calc): 93 mg/dL (calc) (ref <130)
Total CHOL/HDL Ratio: 2.8 (calc) (ref <5.0)
Triglycerides: 70 mg/dL (ref <150)

## 2020-04-18 LAB — PSA: PSA: 1.19 ng/mL (ref <=4.0)

## 2020-04-20 ENCOUNTER — Other Ambulatory Visit: Payer: Self-pay

## 2020-04-20 ENCOUNTER — Encounter: Payer: Self-pay | Admitting: Internal Medicine

## 2020-04-20 ENCOUNTER — Ambulatory Visit (INDEPENDENT_AMBULATORY_CARE_PROVIDER_SITE_OTHER): Payer: Medicare Other | Admitting: Internal Medicine

## 2020-04-20 VITALS — BP 120/80 | HR 99 | Ht 70.0 in | Wt 184.0 lb

## 2020-04-20 DIAGNOSIS — Z86711 Personal history of pulmonary embolism: Secondary | ICD-10-CM | POA: Diagnosis not present

## 2020-04-20 DIAGNOSIS — D126 Benign neoplasm of colon, unspecified: Secondary | ICD-10-CM

## 2020-04-20 DIAGNOSIS — L905 Scar conditions and fibrosis of skin: Secondary | ICD-10-CM | POA: Diagnosis not present

## 2020-04-20 DIAGNOSIS — Z0000 Encounter for general adult medical examination without abnormal findings: Secondary | ICD-10-CM

## 2020-04-20 DIAGNOSIS — N400 Enlarged prostate without lower urinary tract symptoms: Secondary | ICD-10-CM

## 2020-04-20 DIAGNOSIS — D0359 Melanoma in situ of other part of trunk: Secondary | ICD-10-CM | POA: Diagnosis not present

## 2020-04-20 DIAGNOSIS — Z7901 Long term (current) use of anticoagulants: Secondary | ICD-10-CM

## 2020-04-20 DIAGNOSIS — Z86718 Personal history of other venous thrombosis and embolism: Secondary | ICD-10-CM

## 2020-04-20 LAB — POCT URINALYSIS DIPSTICK
Appearance: NEGATIVE
Bilirubin, UA: NEGATIVE
Blood, UA: NEGATIVE
Glucose, UA: NEGATIVE
Ketones, UA: NEGATIVE
Leukocytes, UA: NEGATIVE
Nitrite, UA: NEGATIVE
Odor: NEGATIVE
Protein, UA: NEGATIVE
Spec Grav, UA: 1.025 (ref 1.010–1.025)
Urobilinogen, UA: 0.2 E.U./dL
pH, UA: 6 (ref 5.0–8.0)

## 2020-04-20 MED ORDER — OFLOXACIN 0.3 % OP SOLN: 1.0000 [drp] | Freq: Four times a day (QID) | OPHTHALMIC | 1 refills | Status: DC
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
## 2020-05-02 NOTE — Patient Instructions (Signed)
It was a pleasure to see you today. Continue current medications and follow-up in 1 year. Immunizations are up-to-date. Colonoscopy is up-to-date.

## 2020-05-31 ENCOUNTER — Other Ambulatory Visit: Payer: Self-pay | Admitting: Internal Medicine

## 2020-09-22 ENCOUNTER — Other Ambulatory Visit (HOSPITAL_BASED_OUTPATIENT_CLINIC_OR_DEPARTMENT_OTHER): Payer: Self-pay

## 2020-09-23 ENCOUNTER — Other Ambulatory Visit (HOSPITAL_BASED_OUTPATIENT_CLINIC_OR_DEPARTMENT_OTHER): Payer: Self-pay

## 2020-09-23 ENCOUNTER — Other Ambulatory Visit: Payer: Self-pay

## 2020-09-23 ENCOUNTER — Ambulatory Visit: Payer: Medicare Other | Attending: Internal Medicine

## 2020-09-23 DIAGNOSIS — Z23 Encounter for immunization: Secondary | ICD-10-CM

## 2020-09-23 MED ORDER — COVID-19 MRNA VAC-TRIS(PFIZER) 30 MCG/0.3ML IM SUSP
INTRAMUSCULAR | 0 refills | Status: DC
  Filled 2020-09-23: qty 0.3, 1d supply, fill #0

## 2020-09-23 NOTE — Progress Notes (Signed)
   Covid-19 Vaccination Clinic  Name:  AYDENN GERVIN    MRN: [id number] DOB: July 10, 1950  09/23/2020  Mr. Crall was observed post Covid-19 immunization for 15 minutes without incident. He was provided with Vaccine Information Sheet and instruction to access the V-Safe system.   Mr. Wade was instructed to call 911 with any severe reactions post vaccine: Marland Kitchen Difficulty breathing  . Swelling of face and throat  . A fast heartbeat  . A bad rash all over body  . Dizziness and weakness   Immunizations Administered    Name Date Dose VIS Date Route   PFIZER Comrnaty(Gray TOP) Covid-19 Vaccine 09/23/2020 12:35 PM 0.3 mL 05/14/2020 Intramuscular   Manufacturer: [place]   Lot: [id number]   NDC: [phone]    ]

## 2020-11-26 ENCOUNTER — Other Ambulatory Visit: Payer: Self-pay

## 2020-11-26 ENCOUNTER — Telehealth (INDEPENDENT_AMBULATORY_CARE_PROVIDER_SITE_OTHER): Payer: Medicare Other | Admitting: Internal Medicine

## 2020-11-26 DIAGNOSIS — Z20822 Contact with and (suspected) exposure to covid-19: Secondary | ICD-10-CM | POA: Diagnosis not present

## 2020-11-26 NOTE — Progress Notes (Addendum)
   Subjective:    Patient ID: James Hughes, male    DOB: Jun 19, 1950, 70 y.o.   MRN: [id number]  HPI 70 year old Male with history of DVT and bilateral pulmonary emboli in October 2018 maintained on chronic Xarelto 10 mg daily.  He is identified using 2 identifiers as James Hughes. James Hughes, a longstanding patient in this practice.  He is at his home and I am at my office.  He is agreeable to visit in this format today. Due to the Coronavirus pandemic, he is seen by virtual visit using interactive audio and video telecommunications.  His wife has tested positive for Covid-19 upon returning home from a trip to visit her mother out of state. He is currently asymptomatic but has been exposed.   He has had for COVID-19  immunizations.  He is physically active and watches his diet.  He had annual Medicare wellness exam and health maintenance exam in November 2021.  He has no history of hypertension or hyperlipidemia.  History of BPH and history of urinary retention.  He is maintained on Flomax and Proscar and is followed by urologist.  History of 2 tubular adenomas removed by Dr. Cristina Gong in 2019.  Diagnosed with superficial melanoma by dermatologist in 2021.  He has had very close exposure to his wife who has been traveling out of state to visit her mother and was just diagnosed with COVID-19 after returning home.  Review of Systems he has no dysgeusia, nausea, vomiting, fever or chills at this point in time.     Objective:   Physical Exam  He is seen virtually in no acute distress.  Not heard to be coughing and does not sound hoarse.      Assessment & Plan:   Close exposure to COVID-19.  He and his wife have tried to distance themselves within the home as best they can.  However, I think it would be prudent for him to have a prescription for Paxlovid on hand should his symptoms worsen over the weekend.  This will be prescribed for him and sent via fax to local pharmacy.  Time spent  reviewing chart, interviewing patient virtually, and medical decision making is 15 minutes

## 2020-11-28 ENCOUNTER — Encounter: Payer: Self-pay | Admitting: Internal Medicine

## 2020-11-28 NOTE — Patient Instructions (Addendum)
A Paxlovid prescription (regular strength) is being faxed to pharmacy for patient should symptoms of COVID-19 develop over the weekend.  He has had direct exposure to his wife who has COVID-19.  Due to his age and history of DVT and pulmonary embolus requiring chronic anticoagulation therapy, I feel it is prudent for him to have Paxlovid on hand should symptoms of COVID-19 virus infection develop.

## 2020-11-30 ENCOUNTER — Other Ambulatory Visit: Payer: Self-pay | Admitting: Internal Medicine

## 2021-02-15 ENCOUNTER — Other Ambulatory Visit: Payer: Self-pay

## 2021-02-15 ENCOUNTER — Ambulatory Visit (INDEPENDENT_AMBULATORY_CARE_PROVIDER_SITE_OTHER): Payer: Medicare Other | Admitting: Internal Medicine

## 2021-02-15 DIAGNOSIS — Z23 Encounter for immunization: Secondary | ICD-10-CM | POA: Diagnosis not present

## 2021-02-15 NOTE — Progress Notes (Signed)
Flu vaccine given by CMA 

## 2021-03-23 ENCOUNTER — Ambulatory Visit: Payer: Medicare Other | Attending: Internal Medicine

## 2021-03-23 ENCOUNTER — Other Ambulatory Visit (HOSPITAL_BASED_OUTPATIENT_CLINIC_OR_DEPARTMENT_OTHER): Payer: Self-pay

## 2021-03-23 DIAGNOSIS — Z23 Encounter for immunization: Secondary | ICD-10-CM

## 2021-03-23 MED ORDER — PFIZER COVID-19 VAC BIVALENT 30 MCG/0.3ML IM SUSP
INTRAMUSCULAR | 0 refills | Status: DC
  Filled 2021-03-23: qty 0.3, 1d supply, fill #0

## 2021-03-23 NOTE — Progress Notes (Signed)
   Covid-19 Vaccination Clinic  Name:  VLADIMIR LENHOFF    MRN: [id number] DOB: Dec 10, 1950  03/23/2021  Mr. Kates was observed post Covid-19 immunization for 15 minutes without incident. He was provided with Vaccine Information Sheet and instruction to access the V-Safe system.   Mr. Draheim was instructed to call 911 with any severe reactions post vaccine: Difficulty breathing  Swelling of face and throat  A fast heartbeat  A bad rash all over body  Dizziness and weakness

## 2021-03-24 DIAGNOSIS — R3914 Feeling of incomplete bladder emptying: Secondary | ICD-10-CM | POA: Diagnosis not present

## 2021-03-24 DIAGNOSIS — N401 Enlarged prostate with lower urinary tract symptoms: Secondary | ICD-10-CM | POA: Diagnosis not present

## 2021-04-06 DIAGNOSIS — L814 Other melanin hyperpigmentation: Secondary | ICD-10-CM | POA: Diagnosis not present

## 2021-04-06 DIAGNOSIS — Z23 Encounter for immunization: Secondary | ICD-10-CM | POA: Diagnosis not present

## 2021-04-06 DIAGNOSIS — L821 Other seborrheic keratosis: Secondary | ICD-10-CM | POA: Diagnosis not present

## 2021-04-06 DIAGNOSIS — D225 Melanocytic nevi of trunk: Secondary | ICD-10-CM | POA: Diagnosis not present

## 2021-04-06 DIAGNOSIS — Z86006 Personal history of melanoma in-situ: Secondary | ICD-10-CM | POA: Diagnosis not present

## 2021-04-06 DIAGNOSIS — Z86018 Personal history of other benign neoplasm: Secondary | ICD-10-CM | POA: Diagnosis not present

## 2021-04-06 DIAGNOSIS — D239 Other benign neoplasm of skin, unspecified: Secondary | ICD-10-CM | POA: Diagnosis not present

## 2021-04-06 DIAGNOSIS — L578 Other skin changes due to chronic exposure to nonionizing radiation: Secondary | ICD-10-CM | POA: Diagnosis not present

## 2021-04-19 ENCOUNTER — Other Ambulatory Visit: Payer: Self-pay

## 2021-04-19 ENCOUNTER — Other Ambulatory Visit: Payer: Medicare Other | Admitting: Internal Medicine

## 2021-04-19 DIAGNOSIS — R748 Abnormal levels of other serum enzymes: Secondary | ICD-10-CM

## 2021-04-19 DIAGNOSIS — Z0000 Encounter for general adult medical examination without abnormal findings: Secondary | ICD-10-CM

## 2021-04-19 DIAGNOSIS — Z125 Encounter for screening for malignant neoplasm of prostate: Secondary | ICD-10-CM

## 2021-04-19 DIAGNOSIS — N400 Enlarged prostate without lower urinary tract symptoms: Secondary | ICD-10-CM | POA: Diagnosis not present

## 2021-04-19 DIAGNOSIS — Z13220 Encounter for screening for lipoid disorders: Secondary | ICD-10-CM

## 2021-04-19 DIAGNOSIS — I10 Essential (primary) hypertension: Secondary | ICD-10-CM | POA: Diagnosis not present

## 2021-04-20 LAB — COMPLETE METABOLIC PANEL WITH GFR
AG Ratio: 1.8 (calc) (ref 1.0–2.5)
ALT: 29 U/L (ref 9–46)
AST: 23 U/L (ref 10–35)
Albumin: 4.3 g/dL (ref 3.6–5.1)
Alkaline phosphatase (APISO): 65 U/L (ref 35–144)
BUN: 15 mg/dL (ref 7–25)
CO2: 25 mmol/L (ref 20–32)
Calcium: 9 mg/dL (ref 8.6–10.3)
Chloride: 107 mmol/L (ref 98–110)
Creat: 0.88 mg/dL (ref 0.70–1.28)
Globulin: 2.4 g/dL (calc) (ref 1.9–3.7)
Glucose, Bld: 101 mg/dL — ABNORMAL HIGH (ref 65–99)
Potassium: 4.1 mmol/L (ref 3.5–5.3)
Sodium: 141 mmol/L (ref 135–146)
Total Bilirubin: 1.5 mg/dL — ABNORMAL HIGH (ref 0.2–1.2)
Total Protein: 6.7 g/dL (ref 6.1–8.1)
eGFR: 91 mL/min/{1.73_m2} (ref >=60)

## 2021-04-20 LAB — CBC WITH DIFFERENTIAL/PLATELET
Absolute Monocytes: 372 cells/uL (ref 200–950)
Basophils Absolute: 41 cells/uL (ref 0–200)
Basophils Relative: 0.8 %
Eosinophils Absolute: 61 cells/uL (ref 15–500)
Eosinophils Relative: 1.2 %
HCT: 45.5 % (ref 38.5–50.0)
Hemoglobin: 15.5 g/dL (ref 13.2–17.1)
Lymphs Abs: 1443 cells/uL (ref 850–3900)
MCH: 31.4 pg (ref 27.0–33.0)
MCHC: 34.1 g/dL (ref 32.0–36.0)
MCV: 92.1 fL (ref 80.0–100.0)
MPV: 10.2 fL (ref 7.5–12.5)
Monocytes Relative: 7.3 %
Neutro Abs: 3182 cells/uL (ref 1500–7800)
Neutrophils Relative %: 62.4 %
Platelets: 189 10*3/uL (ref 140–400)
RBC: 4.94 10*6/uL (ref 4.20–5.80)
RDW: 12.4 % (ref 11.0–15.0)
Total Lymphocyte: 28.3 %
WBC: 5.1 10*3/uL (ref 3.8–10.8)

## 2021-04-20 LAB — LIPID PANEL
Cholesterol: 145 mg/dL (ref <200)
HDL: 47 mg/dL (ref >=40)
LDL Cholesterol (Calc): 78 mg/dL (calc)
Non-HDL Cholesterol (Calc): 98 mg/dL (calc) (ref <130)
Total CHOL/HDL Ratio: 3.1 (calc) (ref <5.0)
Triglycerides: 115 mg/dL (ref <150)

## 2021-04-20 LAB — PSA: PSA: 1.05 ng/mL (ref <=4.00)

## 2021-04-22 ENCOUNTER — Other Ambulatory Visit: Payer: Self-pay

## 2021-04-22 ENCOUNTER — Encounter: Payer: Self-pay | Admitting: Internal Medicine

## 2021-04-22 ENCOUNTER — Ambulatory Visit (INDEPENDENT_AMBULATORY_CARE_PROVIDER_SITE_OTHER): Payer: Medicare Other | Admitting: Internal Medicine

## 2021-04-22 VITALS — BP 136/76 | HR 68 | Temp 97.8°F | Ht 69.5 in | Wt 185.0 lb

## 2021-04-22 DIAGNOSIS — Z0000 Encounter for general adult medical examination without abnormal findings: Secondary | ICD-10-CM

## 2021-04-22 DIAGNOSIS — Z86711 Personal history of pulmonary embolism: Secondary | ICD-10-CM | POA: Diagnosis not present

## 2021-04-22 DIAGNOSIS — D126 Benign neoplasm of colon, unspecified: Secondary | ICD-10-CM | POA: Diagnosis not present

## 2021-04-22 DIAGNOSIS — Z7901 Long term (current) use of anticoagulants: Secondary | ICD-10-CM

## 2021-04-22 DIAGNOSIS — Z86718 Personal history of other venous thrombosis and embolism: Secondary | ICD-10-CM

## 2021-04-22 DIAGNOSIS — N400 Enlarged prostate without lower urinary tract symptoms: Secondary | ICD-10-CM

## 2021-04-22 LAB — POCT URINALYSIS DIPSTICK
Bilirubin, UA: NEGATIVE
Blood, UA: NEGATIVE
Glucose, UA: NEGATIVE
Ketones, UA: NEGATIVE
Leukocytes, UA: NEGATIVE
Nitrite, UA: NEGATIVE
Protein, UA: NEGATIVE
Spec Grav, UA: 1.015 (ref 1.010–1.025)
Urobilinogen, UA: 0.2 E.U./dL
pH, UA: 5 (ref 5.0–8.0)
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
## 2021-04-25 NOTE — Patient Instructions (Addendum)
It was a pleasure to see you today.  Continue current medications and return in 1 year or as needed.  Continue to see Urologist on an annual basis.  PSA is normal.  Colonoscopy due 2024.  Immunizations are up-to-date.

## 2021-05-04 DIAGNOSIS — H2513 Age-related nuclear cataract, bilateral: Secondary | ICD-10-CM | POA: Diagnosis not present

## 2021-05-04 DIAGNOSIS — H353132 Nonexudative age-related macular degeneration, bilateral, intermediate dry stage: Secondary | ICD-10-CM | POA: Diagnosis not present

## 2021-05-04 DIAGNOSIS — H524 Presbyopia: Secondary | ICD-10-CM | POA: Diagnosis not present

## 2021-05-30 ENCOUNTER — Other Ambulatory Visit: Payer: Self-pay | Admitting: Internal Medicine

## 2021-10-13 ENCOUNTER — Encounter: Payer: Self-pay | Admitting: Internal Medicine

## 2021-10-13 ENCOUNTER — Telehealth: Payer: Self-pay | Admitting: Internal Medicine

## 2021-10-13 MED ORDER — OFLOXACIN 0.3 % OP SOLN: 2.0000 [drp] | Freq: Four times a day (QID) | OPHTHALMIC | 1 refills | Status: AC

## 2021-10-13 NOTE — Telephone Encounter (Signed)
Called patient to let him know, he will pick up medicine and use compress and come in tomorrow. ?

## 2021-10-13 NOTE — Telephone Encounter (Signed)
James Hughes Gagliardo ?[phone] ? ?James Hughes called to say for a couple of days he has had a right eye that is irritated itchy and a pimple like place on eye lid. I have scheduled him to come in tomorrow to be seen. ?

## 2021-10-13 NOTE — Telephone Encounter (Signed)
Patient called with symptoms c/w with hordeolum and possible conjunctivitis. See tomorrow but begin today Ocuflox eye drops 4 times a day and warm compresses 20 min 3 times a day. MJB, MD ?

## 2021-10-14 ENCOUNTER — Ambulatory Visit (INDEPENDENT_AMBULATORY_CARE_PROVIDER_SITE_OTHER): Payer: Medicare Other | Admitting: Internal Medicine

## 2021-10-14 ENCOUNTER — Encounter: Payer: Self-pay | Admitting: Internal Medicine

## 2021-10-14 VITALS — BP 138/88 | HR 89 | Temp 97.3°F

## 2021-10-14 DIAGNOSIS — Z86711 Personal history of pulmonary embolism: Secondary | ICD-10-CM

## 2021-10-14 DIAGNOSIS — Z7901 Long term (current) use of anticoagulants: Secondary | ICD-10-CM

## 2021-10-14 DIAGNOSIS — H00012 Hordeolum externum right lower eyelid: Secondary | ICD-10-CM | POA: Diagnosis not present

## 2021-10-14 DIAGNOSIS — H1031 Unspecified acute conjunctivitis, right eye: Secondary | ICD-10-CM | POA: Diagnosis not present

## 2021-10-14 NOTE — Progress Notes (Signed)
   Subjective:    Patient ID: James Hughes, male    DOB: [birth year], 71 y.o.   MRN: [id number]  HPI 71 year old Male seen today with right eye itchiness and a small lump has appeared mid lower eyelid. No frank drainage.  His general health is excellent.  He watches his weight and he stays in shape.  He has been working out of state well recently.  He is on chronic anticoagulation for history of pulmonary emboli.  History of BPH treated with Proscar.  Had a bout of acute bacterial conjunctivitis left eye in August 2020 treated with Ocuflox ophthalmic drops and warm compresses.  The only thing we can think of that could have perhaps contributed to symptoms today is that he has been using a towel provided by gym when he is working out.  He called on May 10 and we started Ocuflox ophthalmic drops and warm compresses.  He is here today for follow-up.  Review of Systems no fever.  No URI symptoms.  No sore throat.     Objective:   Physical Exam Blood pressure 138/88, pulse 89, temperature 97.3 degrees by ear thermometer, pulse oximetry 97%  He has hordeolum right lower eyelid.  Has conjunctivitis of right eye.  Extraocular movements are full.  PERRLA.       Assessment & Plan:  Right eye hordeolum  Conjunctivitis right eye  History of chronic anticoagulation for pulmonary emboli  Plan: Continue Ocuflox ophthalmic drops 2 drops 4 times a day.  Warm compresses for 20 minutes 2 or 3 times daily.  This should resolve in 5 to 7 days.

## 2021-11-01 NOTE — Patient Instructions (Signed)
Use Ocuflox ophthalmic drops 2 drops in each eye 4 times a day for 5 to 7 days.  Continue warm compresses for 20 minutes 2-3 times daily.  Hordeolum should resolve in 5 to 7 days.

## 2021-11-25 ENCOUNTER — Other Ambulatory Visit: Payer: Self-pay | Admitting: Internal Medicine

## 2022-02-23 ENCOUNTER — Ambulatory Visit (INDEPENDENT_AMBULATORY_CARE_PROVIDER_SITE_OTHER): Payer: Medicare Other

## 2022-02-23 VITALS — BP 130/80 | Temp 97.8°F

## 2022-02-23 DIAGNOSIS — Z23 Encounter for immunization: Secondary | ICD-10-CM | POA: Diagnosis not present

## 2022-03-16 ENCOUNTER — Other Ambulatory Visit (HOSPITAL_BASED_OUTPATIENT_CLINIC_OR_DEPARTMENT_OTHER): Payer: Self-pay

## 2022-03-16 DIAGNOSIS — Z23 Encounter for immunization: Secondary | ICD-10-CM | POA: Diagnosis not present

## 2022-03-16 MED ORDER — COVID-19 MRNA 2023-2024 VACCINE (COMIRNATY) 0.3 ML INJECTION
INTRAMUSCULAR | 0 refills | Status: DC
  Filled 2022-03-16: qty 0.3, 1d supply, fill #0

## 2022-03-30 DIAGNOSIS — R3914 Feeling of incomplete bladder emptying: Secondary | ICD-10-CM | POA: Diagnosis not present

## 2022-03-30 DIAGNOSIS — N401 Enlarged prostate with lower urinary tract symptoms: Secondary | ICD-10-CM | POA: Diagnosis not present

## 2022-04-12 DIAGNOSIS — D225 Melanocytic nevi of trunk: Secondary | ICD-10-CM | POA: Diagnosis not present

## 2022-04-12 DIAGNOSIS — Z86006 Personal history of melanoma in-situ: Secondary | ICD-10-CM | POA: Diagnosis not present

## 2022-04-12 DIAGNOSIS — L578 Other skin changes due to chronic exposure to nonionizing radiation: Secondary | ICD-10-CM | POA: Diagnosis not present

## 2022-04-12 DIAGNOSIS — D239 Other benign neoplasm of skin, unspecified: Secondary | ICD-10-CM | POA: Diagnosis not present

## 2022-04-12 DIAGNOSIS — L821 Other seborrheic keratosis: Secondary | ICD-10-CM | POA: Diagnosis not present

## 2022-04-12 DIAGNOSIS — Z86018 Personal history of other benign neoplasm: Secondary | ICD-10-CM | POA: Diagnosis not present

## 2022-04-12 DIAGNOSIS — D2271 Melanocytic nevi of right lower limb, including hip: Secondary | ICD-10-CM | POA: Diagnosis not present

## 2022-04-12 DIAGNOSIS — L814 Other melanin hyperpigmentation: Secondary | ICD-10-CM | POA: Diagnosis not present

## 2022-04-25 ENCOUNTER — Other Ambulatory Visit: Payer: Medicare Other

## 2022-04-25 DIAGNOSIS — Z136 Encounter for screening for cardiovascular disorders: Secondary | ICD-10-CM

## 2022-04-25 DIAGNOSIS — R748 Abnormal levels of other serum enzymes: Secondary | ICD-10-CM | POA: Diagnosis not present

## 2022-04-25 DIAGNOSIS — I10 Essential (primary) hypertension: Secondary | ICD-10-CM | POA: Diagnosis not present

## 2022-04-25 DIAGNOSIS — Z13220 Encounter for screening for lipoid disorders: Secondary | ICD-10-CM | POA: Diagnosis not present

## 2022-04-25 DIAGNOSIS — N400 Enlarged prostate without lower urinary tract symptoms: Secondary | ICD-10-CM | POA: Diagnosis not present

## 2022-04-26 ENCOUNTER — Ambulatory Visit (INDEPENDENT_AMBULATORY_CARE_PROVIDER_SITE_OTHER): Payer: Medicare Other | Admitting: Internal Medicine

## 2022-04-26 ENCOUNTER — Encounter: Payer: Self-pay | Admitting: Internal Medicine

## 2022-04-26 VITALS — BP 124/80 | HR 83 | Temp 98.3°F | Ht 69.75 in | Wt 173.1 lb

## 2022-04-26 DIAGNOSIS — N400 Enlarged prostate without lower urinary tract symptoms: Secondary | ICD-10-CM

## 2022-04-26 DIAGNOSIS — Z23 Encounter for immunization: Secondary | ICD-10-CM | POA: Diagnosis not present

## 2022-04-26 DIAGNOSIS — Z8249 Family history of ischemic heart disease and other diseases of the circulatory system: Secondary | ICD-10-CM | POA: Diagnosis not present

## 2022-04-26 DIAGNOSIS — Z86718 Personal history of other venous thrombosis and embolism: Secondary | ICD-10-CM

## 2022-04-26 DIAGNOSIS — Z86711 Personal history of pulmonary embolism: Secondary | ICD-10-CM

## 2022-04-26 DIAGNOSIS — Z7901 Long term (current) use of anticoagulants: Secondary | ICD-10-CM

## 2022-04-26 DIAGNOSIS — D126 Benign neoplasm of colon, unspecified: Secondary | ICD-10-CM

## 2022-04-26 DIAGNOSIS — Z0000 Encounter for general adult medical examination without abnormal findings: Secondary | ICD-10-CM

## 2022-04-26 LAB — POCT URINALYSIS DIPSTICK
Bilirubin, UA: NEGATIVE
Blood, UA: NEGATIVE
Glucose, UA: NEGATIVE
Leukocytes, UA: NEGATIVE
Nitrite, UA: NEGATIVE
Protein, UA: NEGATIVE
Spec Grav, UA: 1.015 (ref 1.010–1.025)
Urobilinogen, UA: 0.2 E.U./dL
pH, UA: 6 (ref 5.0–8.0)

## 2022-04-26 LAB — COMPLETE METABOLIC PANEL WITH GFR
AG Ratio: 1.7 (calc) (ref 1.0–2.5)
ALT: 24 U/L (ref 9–46)
AST: 21 U/L (ref 10–35)
Albumin: 4.4 g/dL (ref 3.6–5.1)
Alkaline phosphatase (APISO): 63 U/L (ref 35–144)
BUN: 13 mg/dL (ref 7–25)
CO2: 24 mmol/L (ref 20–32)
Calcium: 9.5 mg/dL (ref 8.6–10.3)
Chloride: 105 mmol/L (ref 98–110)
Creat: 0.96 mg/dL (ref 0.70–1.28)
Globulin: 2.6 g/dL (calc) (ref 1.9–3.7)
Glucose, Bld: 98 mg/dL (ref 65–99)
Potassium: 4.4 mmol/L (ref 3.5–5.3)
Sodium: 140 mmol/L (ref 135–146)
Total Bilirubin: 1.3 mg/dL — ABNORMAL HIGH (ref 0.2–1.2)
Total Protein: 7 g/dL (ref 6.1–8.1)
eGFR: 83 mL/min/{1.73_m2} (ref >=60)

## 2022-04-26 LAB — LIPID PANEL
Cholesterol: 165 mg/dL (ref <200)
HDL: 63 mg/dL (ref >=40)
LDL Cholesterol (Calc): 83 mg/dL (calc)
Non-HDL Cholesterol (Calc): 102 mg/dL (calc) (ref <130)
Total CHOL/HDL Ratio: 2.6 (calc) (ref <5.0)
Triglycerides: 91 mg/dL (ref <150)

## 2022-04-26 LAB — CBC WITH DIFFERENTIAL/PLATELET
Absolute Monocytes: 388 cells/uL (ref 200–950)
Basophils Absolute: 41 cells/uL (ref 0–200)
Basophils Relative: 0.6 %
Eosinophils Absolute: 48 cells/uL (ref 15–500)
Eosinophils Relative: 0.7 %
HCT: 44.6 % (ref 38.5–50.0)
Hemoglobin: 15.7 g/dL (ref 13.2–17.1)
Lymphs Abs: 1353 cells/uL (ref 850–3900)
MCH: 31.8 pg (ref 27.0–33.0)
MCHC: 35.2 g/dL (ref 32.0–36.0)
MCV: 90.5 fL (ref 80.0–100.0)
MPV: 9.9 fL (ref 7.5–12.5)
Monocytes Relative: 5.7 %
Neutro Abs: 4971 cells/uL (ref 1500–7800)
Neutrophils Relative %: 73.1 %
Platelets: 190 10*3/uL (ref 140–400)
RBC: 4.93 10*6/uL (ref 4.20–5.80)
RDW: 11.9 % (ref 11.0–15.0)
Total Lymphocyte: 19.9 %
WBC: 6.8 10*3/uL (ref 3.8–10.8)

## 2022-04-26 LAB — PSA: PSA: 1.12 ng/mL (ref <=4.00)
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
## 2022-04-27 ENCOUNTER — Other Ambulatory Visit (HOSPITAL_BASED_OUTPATIENT_CLINIC_OR_DEPARTMENT_OTHER): Payer: Self-pay

## 2022-04-27 MED ORDER — TETANUS-DIPHTH-ACELL PERTUSSIS 5-2.5-18.5 LF-MCG/0.5 IM SUSY
PREFILLED_SYRINGE | INTRAMUSCULAR | 0 refills | Status: DC
  Filled 2022-04-27: qty 0.5, 1d supply, fill #0

## 2022-05-02 ENCOUNTER — Other Ambulatory Visit: Payer: Self-pay

## 2022-05-05 NOTE — Patient Instructions (Signed)
It was a pleasure to see you today.  Labs are stable.  Have repeat colonoscopy in 2024 and continue with Xarelto for history of lumbar and segmental pulmonary emboli onset in 2019.  Chronic anticoagulation has been recommended.  Continue follow-up for BPH with Dr. Abner Greenspan.  May consider RSV vaccine if around small children or number of cases increases considerably.  Tetanus immunization is up-to-date.  Consider pneumococcal 20 vaccine.  Flu vaccine has been given.  Tetanus immunization given November 22.  Return in 1 year or as needed.

## 2022-05-10 DIAGNOSIS — H2513 Age-related nuclear cataract, bilateral: Secondary | ICD-10-CM | POA: Diagnosis not present

## 2022-05-10 DIAGNOSIS — H524 Presbyopia: Secondary | ICD-10-CM | POA: Diagnosis not present

## 2022-05-12 ENCOUNTER — Ambulatory Visit (HOSPITAL_COMMUNITY)
Admission: RE | Admit: 2022-05-12 | Discharge: 2022-05-12 | Disposition: A | Discharge status: Home / Self Care | Payer: Medicare Other | Source: Ambulatory Visit | Attending: Internal Medicine | Admitting: Internal Medicine

## 2022-05-12 DIAGNOSIS — Z8249 Family history of ischemic heart disease and other diseases of the circulatory system: Secondary | ICD-10-CM | POA: Insufficient documentation

## 2022-05-13 NOTE — Progress Notes (Signed)
Placed referral  

## 2022-05-25 ENCOUNTER — Other Ambulatory Visit: Payer: Self-pay

## 2022-05-25 MED ORDER — RIVAROXABAN 10 MG PO TABS: ORAL_TABLET | ORAL | 1 refills | Status: DC

## 2022-07-05 NOTE — Progress Notes (Unsigned)
Cardiology Office Note   Date:  07/06/2022   ID:  James Hughes, DOB [birth year], MRN [id number]  PCP:  James Showers, MD    No chief complaint on file.  Coronary artery calcification  Wt Readings from Last 3 Encounters:  07/06/22 168 lb 3.2 oz (76.3 kg)  04/26/22 173 lb 1.9 oz (78.5 kg)  04/22/21 185 lb (83.9 kg)       History of Present Illness: James Hughes is a 72 y.o. male who is being seen today for the evaluation of coronary artery calcification at the request of Baxley, Cresenciano Lick, MD.   He had a calcium scoring CT in December 2023 showing: "Coronary Calcium Score:   Left main: 172   Left anterior descending artery: 15   Left circumflex artery: 0   Right coronary artery: 0   Total: 187   Percentile: 48th   Pericardium: Normal.   Non-cardiac: See separate report from [facility].   IMPRESSION: 1. Coronary calcium score of 187. This was 48th percentile for age-, race-, and sex-matched controls."  Father died at age 5, had MI.  Siblings are healthy. No early heart disease.   Denies : Chest pain. Dizziness. Leg edema. Nitroglycerin use. Orthopnea. Palpitations. Paroxysmal nocturnal dyspnea. Shortness of breath. Syncope.    Lost 25 lbs.  Alternates weights and cardio.      Worked at [employer].    Past Medical History:  Diagnosis Date   Elevated liver enzymes 04/29/2017   Prostatitis 04/29/2017   Pulmonary embolism ([place]) 03/18/2017   White coat syndrome with hypertension 03/18/2017    History reviewed. No pertinent surgical history.   Current Outpatient Medications  Medication Sig Dispense Refill   cetirizine (ZYRTEC) 10 MG tablet Take 5 mg by mouth daily as needed for allergies.     COVID-19 mRNA vaccine 2023-2024 (COMIRNATY) SUSP injection Inject into the muscle. 0.3 mL 0   fexofenadine (ALLEGRA) 180 MG tablet Take 180 mg by mouth daily.     finasteride (PROSCAR) 5 MG tablet Take 5 mg by mouth daily.     Multiple  Vitamins-Minerals (PRESERVISION AREDS 2 PO) Take by mouth.     ofloxacin (OCUFLOX) 0.3 % ophthalmic solution Place 2 drops into both eyes 4 (four) times daily. 5 mL 1   ondansetron (ZOFRAN-ODT) 4 MG disintegrating tablet Take 4 mg by mouth every 8 (eight) hours as needed.     rivaroxaban (XARELTO) 10 MG TABS tablet TAKE 1 TABLET(10 MG) BY MOUTH DAILY 90 tablet 1   rosuvastatin (CRESTOR) 10 MG tablet Take 1 tablet (10 mg total) by mouth daily. 90 tablet 3   tamsulosin (FLOMAX) 0.4 MG CAPS capsule Take 1 capsule (0.4 mg total) by mouth daily. (Patient taking differently: Take 0.8 mg by mouth daily.) 30 capsule 2   Tdap (BOOSTRIX) 5-2.5-18.5 LF-MCG/0.5 injection Inject into the muscle. 0.5 mL 0   No current facility-administered medications for this visit.    Allergies:   Bacitracin-polymyxin b, Penicillins, Sulfa antibiotics, and Sulfur    Social History:  The patient  reports that he has never smoked. He has never used smokeless tobacco. He reports current alcohol use of about 3.0 standard drinks of alcohol per week. He reports that he does not use drugs.   Family History:  The patient's family history includes Cancer in his mother; Pulmonary embolism in his father.    ROS:  Please see the history of present illness.   Otherwise, review of systems are positive for .  All other systems are reviewed and negative.    PHYSICAL EXAM: VS:  BP (!) 144/74   Pulse (!) 102   Ht '5\' 9"'$  (1.753 m)   Wt 168 lb 3.2 oz (76.3 kg)   SpO2 97%   BMI 24.84 kg/m  , BMI Body mass index is 24.84 kg/m. GEN: Well nourished, well developed, in no acute distress HEENT: normal Neck: no JVD, carotid bruits, or masses Cardiac: RRR; no murmurs, rubs, or gallops,no edema  Respiratory:  clear to auscultation bilaterally, normal work of breathing GI: soft, nontender, nondistended, + BS MS: no deformity or atrophy Skin: warm and dry, no rash Neuro:  Strength and sensation are intact Psych: euthymic mood, full  affect   EKG:   The ekg ordered today demonstrates sinus tach, otherwise normal, no ST changes   Recent Labs: 04/25/2022: ALT 24; BUN 13; Creat 0.96; Hemoglobin 15.7; Platelets 190; Potassium 4.4; Sodium 140   Lipid Panel    Component Value Date/Time   CHOL 165 04/25/2022 0948   TRIG 91 04/25/2022 0948   HDL 63 04/25/2022 0948   CHOLHDL 2.6 04/25/2022 0948   VLDL 18 03/14/2016 1023   LDLCALC 83 04/25/2022 0948     Other studies Reviewed: Additional studies/ records that were reviewed today with results demonstrating: Labs in November 2023 showed total cholesterol 165, HDL 63, LDL 83, triglycerides 91.   ASSESSMENT AND PLAN:  Coronary artery calcification: No angina.  He remains very active at the gym.  He is trying to maintain the weight loss described above.  He has lost 3 inches on his waist size.  We spoke in depth about his calcium score.  He is in the the 48th percentile.  Will start rosuvastatin 10 mg daily.  Liver and lipid check in 3 months.  LDL is already well-controlled at 83 HDL 63.  He has any side effects from the medicine, he will let us know.  Hopefully, this will help prevent further deposition of plaque. Prior PE: Has been on chronic anticoagulation with Xarelto.  No bleeding problems. Family h/o early CAD: Father with Mi at an early age.  No other family members with early CAD.    Current medicines are reviewed at length with the patient today.  The patient concerns regarding his medicines were addressed.  The following changes have been made:  No change  Labs/ tests ordered today include:   Orders Placed This Encounter  Procedures   Lipid Profile   Hepatic function panel   EKG 12-Lead    Recommend 150 minutes/week of aerobic exercise Low fat, low carb, high fiber diet recommended  Disposition:   FU in 1 year   Signed, Larae Grooms, MD  07/06/2022 9:32 AM    [facility] [place], [place], [place]   [address] Phone: [phone]; Fax: [phone]

## 2022-07-06 ENCOUNTER — Ambulatory Visit: Payer: Medicare Other | Attending: Interventional Cardiology | Admitting: Interventional Cardiology

## 2022-07-06 ENCOUNTER — Encounter: Payer: Self-pay | Admitting: Interventional Cardiology

## 2022-07-06 VITALS — BP 144/74 | HR 102 | Ht 69.0 in | Wt 168.2 lb

## 2022-07-06 DIAGNOSIS — Z7901 Long term (current) use of anticoagulants: Secondary | ICD-10-CM | POA: Diagnosis not present

## 2022-07-06 DIAGNOSIS — Z8249 Family history of ischemic heart disease and other diseases of the circulatory system: Secondary | ICD-10-CM | POA: Diagnosis not present

## 2022-07-06 DIAGNOSIS — I2584 Coronary atherosclerosis due to calcified coronary lesion: Secondary | ICD-10-CM | POA: Insufficient documentation

## 2022-07-06 DIAGNOSIS — I2510 Atherosclerotic heart disease of native coronary artery without angina pectoris: Secondary | ICD-10-CM | POA: Diagnosis not present

## 2022-07-06 MED ORDER — ROSUVASTATIN CALCIUM 10 MG PO TABS: 10.0000 mg | ORAL_TABLET | Freq: Every day | ORAL | 3 refills | Status: DC

## 2022-07-06 NOTE — Patient Instructions (Signed)
Medication Instructions:  Your physician has recommended you make the following change in your medication: Start Rosuvastatin 10 mg by mouth daily   *If you need a refill on your cardiac medications before your next appointment, please call your pharmacy*   Lab Work: Your physician recommends that you return for lab work on October 03, 2022.  This will be fasting.  The lab opens at 7:15 AM  If you have labs (blood work) drawn today and your tests are completely normal, you will receive your results only by: [place] (if you have MyChart) OR A paper copy in the mail If you have any lab test that is abnormal or we need to change your treatment, we will call you to review the results.   Testing/Procedures: none   Follow-Up: At [facility], you and your health needs are our priority.  As part of our continuing mission to provide you with exceptional heart care, we have created designated Provider Care Teams.  These Care Teams include your primary Cardiologist (physician) and Advanced Practice Providers (APPs -  Physician Assistants and Nurse Practitioners) who all work together to provide you with the care you need, when you need it.  We recommend signing up for the patient portal called "MyChart".  Sign up information is provided on this After Visit Summary.  MyChart is used to connect with patients for Virtual Visits (Telemedicine).  Patients are able to view lab/test results, encounter notes, upcoming appointments, etc.  Non-urgent messages can be sent to your provider as well.   To learn more about what you can do with MyChart, go to [email or web address].    Your next appointment:   12 month(s)  Provider:   Larae Grooms, MD     Other Instructions

## 2022-10-03 ENCOUNTER — Ambulatory Visit: Payer: Medicare Other | Attending: Cardiovascular Disease

## 2022-10-03 DIAGNOSIS — I2510 Atherosclerotic heart disease of native coronary artery without angina pectoris: Secondary | ICD-10-CM

## 2022-10-03 DIAGNOSIS — Z8249 Family history of ischemic heart disease and other diseases of the circulatory system: Secondary | ICD-10-CM | POA: Diagnosis not present

## 2022-10-03 DIAGNOSIS — I2584 Coronary atherosclerosis due to calcified coronary lesion: Secondary | ICD-10-CM | POA: Diagnosis not present

## 2022-10-04 LAB — HEPATIC FUNCTION PANEL
ALT: 28 IU/L (ref 0–44)
AST: 24 IU/L (ref 0–40)
Albumin: 4.4 g/dL (ref 3.8–4.8)
Alkaline Phosphatase: 70 IU/L (ref 44–121)
Bilirubin Total: 1.6 mg/dL — ABNORMAL HIGH (ref 0.0–1.2)
Bilirubin, Direct: 0.41 mg/dL — ABNORMAL HIGH (ref 0.00–0.40)
Total Protein: 6.5 g/dL (ref 6.0–8.5)

## 2022-10-04 LAB — LIPID PANEL
Chol/HDL Ratio: 1.8 ratio (ref 0.0–5.0)
Cholesterol, Total: 105 mg/dL (ref 100–199)
HDL: 60 mg/dL (ref >39)
LDL Chol Calc (NIH): 27 mg/dL (ref 0–99)
Triglycerides: 91 mg/dL (ref 0–149)
VLDL Cholesterol Cal: 18 mg/dL (ref 5–40)

## 2022-10-18 DIAGNOSIS — D225 Melanocytic nevi of trunk: Secondary | ICD-10-CM | POA: Diagnosis not present

## 2022-10-18 DIAGNOSIS — L578 Other skin changes due to chronic exposure to nonionizing radiation: Secondary | ICD-10-CM | POA: Diagnosis not present

## 2022-10-18 DIAGNOSIS — Z86018 Personal history of other benign neoplasm: Secondary | ICD-10-CM | POA: Diagnosis not present

## 2022-10-18 DIAGNOSIS — L821 Other seborrheic keratosis: Secondary | ICD-10-CM | POA: Diagnosis not present

## 2022-10-18 DIAGNOSIS — Z86006 Personal history of melanoma in-situ: Secondary | ICD-10-CM | POA: Diagnosis not present

## 2022-10-18 DIAGNOSIS — L814 Other melanin hyperpigmentation: Secondary | ICD-10-CM | POA: Diagnosis not present

## 2022-10-18 DIAGNOSIS — D239 Other benign neoplasm of skin, unspecified: Secondary | ICD-10-CM | POA: Diagnosis not present

## 2022-10-18 DIAGNOSIS — D2271 Melanocytic nevi of right lower limb, including hip: Secondary | ICD-10-CM | POA: Diagnosis not present

## 2022-11-18 ENCOUNTER — Other Ambulatory Visit: Payer: Self-pay | Admitting: Internal Medicine

## 2022-12-15 ENCOUNTER — Encounter: Payer: Self-pay | Admitting: Internal Medicine

## 2023-02-09 ENCOUNTER — Telehealth: Payer: Self-pay | Admitting: *Deleted

## 2023-02-09 NOTE — Telephone Encounter (Signed)
   Name: James Hughes  DOB: Dec 26, 1950  MRN: [id number]  Primary Cardiologist: Lance Muss, MD   Preoperative team, please contact this patient and set up a phone call appointment for further preoperative risk assessment. Please obtain consent and complete medication review. Thank you for your help.  Patient's Xarelto (for prior PE) is managed by patient's PCP. Please forward request for clearance recommendations to patient's PCP.   Perlie Gold, PA-C 02/09/2023, 11:02 AM [facility]

## 2023-02-09 NOTE — Telephone Encounter (Signed)
   Pre-operative Risk Assessment    Patient Name: James Hughes  DOB: 01-30-1951 MRN: [id number]   DATE OF LAST VISIT: 07/06/22 DR. VARANASI DATE OF NEXT VISIT: NONE    Request for Surgical Clearance    Procedure:   COLONOSCOPY  Date of Surgery:  Clearance TBD                                 Surgeon:  DR. Matthias Hughs Surgeon's Group or Practice Name:  [facility] Phone number:  [phone] Fax number:  [phone]   Type of Clearance Requested:   - Medical  - Pharmacy:  Hold Rivaroxaban (Xarelto)     Type of Anesthesia:   PROPOFOL   Additional requests/questions:    Elpidio Anis   02/09/2023, 8:56 AM

## 2023-02-09 NOTE — Telephone Encounter (Signed)
Pharmacy please advise on holding Xarelto prior to colonoscopy, date TBD. Thank you.   Perlie Gold, PA-C

## 2023-02-09 NOTE — Telephone Encounter (Signed)
Pt on low dose Xarelto for history of PE. PCP is prescribing med, recommend clearance come from managing provider.

## 2023-02-10 ENCOUNTER — Telehealth: Payer: Self-pay

## 2023-02-10 NOTE — Telephone Encounter (Signed)
  Patient Consent for Virtual Visit         [name] has provided verbal consent on 02/10/2023 for a virtual visit (video or telephone).   CONSENT FOR VIRTUAL VISIT FOR:  James Hughes  By participating in this virtual visit I agree to the following:  I hereby voluntarily request, consent and authorize [facility] and its employed or contracted physicians, physician assistants, nurse practitioners or other licensed health care professionals (the Practitioner), to provide me with telemedicine health care services (the "Services") as deemed necessary by the treating Practitioner. I acknowledge and consent to receive the Services by the Practitioner via telemedicine. I understand that the telemedicine visit will involve communicating with the Practitioner through live audiovisual communication technology and the disclosure of certain medical information by electronic transmission. I acknowledge that I have been given the opportunity to request an in-person assessment or other available alternative prior to the telemedicine visit and am voluntarily participating in the telemedicine visit.  I understand that I have the right to withhold or withdraw my consent to the use of telemedicine in the course of my care at any time, without affecting my right to future care or treatment, and that the Practitioner or I may terminate the telemedicine visit at any time. I understand that I have the right to inspect all information obtained and/or recorded in the course of the telemedicine visit and may receive copies of available information for a reasonable fee.  I understand that some of the potential risks of receiving the Services via telemedicine include:  Delay or interruption in medical evaluation due to technological equipment failure or disruption; Information transmitted may not be sufficient (e.g. poor resolution of images) to allow for appropriate medical decision making by the  Practitioner; and/or  In rare instances, security protocols could fail, causing a breach of personal health information.  Furthermore, I acknowledge that it is my responsibility to provide information about my medical history, conditions and care that is complete and accurate to the best of my ability. I acknowledge that Practitioner's advice, recommendations, and/or decision may be based on factors not within their control, such as incomplete or inaccurate data provided by me or distortions of diagnostic images or specimens that may result from electronic transmissions. I understand that the practice of medicine is not an exact science and that Practitioner makes no warranties or guarantees regarding treatment outcomes. I acknowledge that a copy of this consent can be made available to me via my patient portal [facility] MyChart), or I can request a printed copy by calling the office of [facility].    I understand that my insurance will be billed for this visit.   I have read or had this consent read to me. I understand the contents of this consent, which adequately explains the benefits and risks of the Services being provided via telemedicine.  I have been provided ample opportunity to ask questions regarding this consent and the Services and have had my questions answered to my satisfaction. I give my informed consent for the services to be provided through the use of telemedicine in my medical care

## 2023-02-10 NOTE — Telephone Encounter (Signed)
Patient scheduled for tele visit on 03/06/23. Med rec and consent done

## 2023-02-13 ENCOUNTER — Other Ambulatory Visit (HOSPITAL_BASED_OUTPATIENT_CLINIC_OR_DEPARTMENT_OTHER): Payer: Self-pay

## 2023-02-13 DIAGNOSIS — Z23 Encounter for immunization: Secondary | ICD-10-CM | POA: Diagnosis not present

## 2023-02-13 MED ORDER — COMIRNATY 30 MCG/0.3ML IM SUSY
0.3000 mL | PREFILLED_SYRINGE | Freq: Once | INTRAMUSCULAR | 0 refills | Status: AC
  Filled 2023-02-13: qty 0.3, 1d supply, fill #0

## 2023-02-21 ENCOUNTER — Other Ambulatory Visit (HOSPITAL_BASED_OUTPATIENT_CLINIC_OR_DEPARTMENT_OTHER): Payer: Self-pay

## 2023-02-21 DIAGNOSIS — Z23 Encounter for immunization: Secondary | ICD-10-CM | POA: Diagnosis not present

## 2023-02-21 MED ORDER — INFLUENZA VAC A&B SURF ANT ADJ 0.5 ML IM SUSY
0.5000 mL | PREFILLED_SYRINGE | Freq: Once | INTRAMUSCULAR | 0 refills | Status: AC
  Filled 2023-02-21: qty 0.5, 1d supply, fill #0

## 2023-02-28 ENCOUNTER — Other Ambulatory Visit (HOSPITAL_BASED_OUTPATIENT_CLINIC_OR_DEPARTMENT_OTHER): Payer: Self-pay

## 2023-02-28 MED ORDER — PEG 3350-KCL-NA BICARB-NACL 420 G PO SOLR
ORAL | 0 refills | Status: AC
  Filled 2023-02-28: qty 4000, 1d supply, fill #0

## 2023-03-06 ENCOUNTER — Ambulatory Visit: Payer: Medicare Other | Attending: Nurse Practitioner

## 2023-03-06 DIAGNOSIS — Z01810 Encounter for preprocedural cardiovascular examination: Secondary | ICD-10-CM

## 2023-03-06 NOTE — Progress Notes (Signed)
Virtual Visit via Telephone Note   Because of James Hughes's co-morbid illnesses, he is at least at moderate risk for complications without adequate follow up.  This format is felt to be most appropriate for this patient at this time.  The patient did not have access to video technology/had technical difficulties with video requiring transitioning to audio format only (telephone).  All issues noted in this document were discussed and addressed.  No physical exam could be performed with this format.  Please refer to the patient's chart for his consent to telehealth for [facility].  Evaluation Performed:  Preoperative cardiovascular risk assessment _____________   Date:  03/06/2023   Patient ID:  James Hughes, DOB 06-17-1950, MRN [id number] Patient Location:  Home Provider location:   Office  Primary Care Provider:  Margaree Mackintosh, MD Primary Cardiologist:  Lance Muss, MD  Chief Complaint / Patient Profile   72 y.o. y/o male with a h/o coronary calcifications, PE, whitecoat HTN who is pending colonoscopy and presents today for telephonic preoperative cardiovascular risk assessment.  History of Present Illness    James Hughes is a 72 y.o. male who presents via audio/video conferencing for a telehealth visit today.  Pt was last seen in cardiology clinic on 07/06/2022 by Dr. Eldridge Dace.  At that time [name] was doing well with no cardiac complaints and staying active.  The patient is now pending procedure as outlined above. Since his last visit, he reports doing well and staying very active with working out at [employer] once a day weekly.  He denies chest pain, shortness of breath, lower extremity edema, fatigue, palpitations, melena, hematuria, hemoptysis, diaphoresis, weakness, presyncope, syncope, orthopnea, and PND.    Past Medical History    Past Medical History:  Diagnosis Date   Elevated liver enzymes 04/29/2017   Prostatitis 04/29/2017    Pulmonary embolism ([facility]) 03/18/2017   White coat syndrome with hypertension 03/18/2017   No past surgical history on file.  Allergies  Allergies  Allergen Reactions   Bacitracin-Polymyxin B Other (See Comments)   Penicillins Hives    Has patient had a PCN reaction causing immediate rash, facial/tongue/throat swelling, SOB or lightheadedness with hypotension: No Has patient had a PCN reaction causing severe rash involving mucus membranes or skin necrosis: No Has patient had a PCN reaction that required hospitalization: No Has patient had a PCN reaction occurring within the last 10 years: No If all of the above answers are "NO", then may proceed with Cephalosporin use.    Sulfa Antibiotics Hives   Sulfur Other (See Comments)    Home Medications    Prior to Admission medications   Medication Sig Start Date End Date Taking? Authorizing Provider  cetirizine (ZYRTEC) 10 MG tablet Take 5 mg by mouth daily as needed for allergies.    [provider]  COVID-19 mRNA vaccine [phone] (COMIRNATY) SUSP injection Inject into the muscle. 03/16/22   Judyann Munson, MD  fexofenadine (ALLEGRA) 180 MG tablet Take 180 mg by mouth daily.    [provider]  finasteride (PROSCAR) 5 MG tablet Take 5 mg by mouth daily. 05/25/22   [provider]  Multiple Vitamins-Minerals (PRESERVISION AREDS 2 PO) Take by mouth.    [provider]  ofloxacin (OCUFLOX) 0.3 % ophthalmic solution Place 2 drops into both eyes 4 (four) times daily. 10/13/21   Margaree Mackintosh, MD  ondansetron (ZOFRAN-ODT) 4 MG disintegrating tablet Take 4 mg by mouth every 8 (eight)  hours as needed. 04/08/22   [provider]  polyethylene glycol-electrolytes (NULYTELY) 420 g solution Use as directed with Dulcolax. 02/09/23     rosuvastatin (CRESTOR) 10 MG tablet Take 1 tablet (10 mg total) by mouth daily. 07/06/22   Corky Crafts, MD  tamsulosin (FLOMAX) 0.4 MG CAPS capsule Take 1 capsule (0.4 mg  total) by mouth daily. Patient taking differently: Take 0.8 mg by mouth daily. 11/25/18   Gilda Crease, MD  Tdap Leda Min) 5-2.5-18.5 LF-MCG/0.5 injection Inject into the muscle. 04/27/22   Judyann Munson, MD  XARELTO 10 MG TABS tablet TAKE 1 TABLET(10 MG) BY MOUTH DAILY 11/18/22   Margaree Mackintosh, MD    Physical Exam    Vital Signs:  CALLEN VANCUREN does not have vital signs available for review today.  Given telephonic nature of communication, physical exam is limited. AAOx3. NAD. Normal affect.  Speech and respirations are unlabored.  Accessory Clinical Findings    None  Assessment & Plan    1.  Preoperative Cardiovascular Risk Assessment: -Patient's RCRI score is 0.9% The patient affirms he has been doing well without any new cardiac symptoms. They are able to achieve 7 METS without cardiac limitations. Therefore, based on ACC/AHA guidelines, the patient would be at acceptable risk for the planned procedure without further cardiovascular testing. The patient was advised that if he develops new symptoms prior to surgery to contact our office to arrange for a follow-up visit, and he verbalized understanding.   The patient was advised that if he develops new symptoms prior to surgery to contact our office to arrange for a follow-up visit, and he verbalized understanding.  Patient's Xarelto (for prior PE) is managed by patient's PCP. Please forward request for clearance recommendations to patient's PCP.  A copy of this note will be routed to requesting surgeon.  Time:   Today, I have spent 6 minutes with the patient with telehealth technology discussing medical history, symptoms, and management plan.     Napoleon Form, Leodis Rains, NP  03/06/2023, 7:40 AM

## 2023-03-14 ENCOUNTER — Telehealth: Payer: Self-pay | Admitting: Internal Medicine

## 2023-03-14 NOTE — Telephone Encounter (Signed)
Received a prior Auth for Kylar to stop taking his Xarelto 3 days prior to his colonoscopy and to return taking 1 day after. It was signed by Dr Lenord Fellers and faxed back. [phone] Phone [phone]

## 2023-03-14 NOTE — Telephone Encounter (Signed)
-------  Fax Transmission Report-------  To:               Recipient at [phone] Subject:          Fw: Hp Scans Result:           The transmission was successful. Explanation:      All Pages Ok Pages Sent:       3 Connect Time:     1 minutes, 27 seconds Transmit Time:    03/14/2023 12:35 Transfer Rate:    14400 Status Code:      0000 Retry Count:      0 Job Id:           [id number] Unique Id:        [id number] Fax Line:         50 Fax Server:       [employer]

## 2023-03-21 DIAGNOSIS — D123 Benign neoplasm of transverse colon: Secondary | ICD-10-CM | POA: Diagnosis not present

## 2023-03-21 DIAGNOSIS — Z860100 Personal history of colon polyps, unspecified: Secondary | ICD-10-CM | POA: Diagnosis not present

## 2023-03-21 DIAGNOSIS — Z09 Encounter for follow-up examination after completed treatment for conditions other than malignant neoplasm: Secondary | ICD-10-CM | POA: Diagnosis not present

## 2023-03-21 DIAGNOSIS — K5730 Diverticulosis of large intestine without perforation or abscess without bleeding: Secondary | ICD-10-CM | POA: Diagnosis not present

## 2023-03-21 DIAGNOSIS — K635 Polyp of colon: Secondary | ICD-10-CM | POA: Diagnosis not present

## 2023-03-21 LAB — HM COLONOSCOPY

## 2023-03-24 ENCOUNTER — Other Ambulatory Visit: Payer: Self-pay | Admitting: Interventional Cardiology

## 2023-03-31 DIAGNOSIS — R3914 Feeling of incomplete bladder emptying: Secondary | ICD-10-CM | POA: Diagnosis not present

## 2023-03-31 DIAGNOSIS — N401 Enlarged prostate with lower urinary tract symptoms: Secondary | ICD-10-CM | POA: Diagnosis not present

## 2023-04-03 ENCOUNTER — Encounter: Payer: Self-pay | Admitting: Internal Medicine
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
## 2023-05-02 DIAGNOSIS — L578 Other skin changes due to chronic exposure to nonionizing radiation: Secondary | ICD-10-CM | POA: Diagnosis not present

## 2023-05-02 DIAGNOSIS — Z86006 Personal history of melanoma in-situ: Secondary | ICD-10-CM | POA: Diagnosis not present

## 2023-05-02 DIAGNOSIS — D225 Melanocytic nevi of trunk: Secondary | ICD-10-CM | POA: Diagnosis not present

## 2023-05-02 DIAGNOSIS — Z86018 Personal history of other benign neoplasm: Secondary | ICD-10-CM | POA: Diagnosis not present

## 2023-05-02 DIAGNOSIS — L821 Other seborrheic keratosis: Secondary | ICD-10-CM | POA: Diagnosis not present

## 2023-05-02 DIAGNOSIS — L814 Other melanin hyperpigmentation: Secondary | ICD-10-CM | POA: Diagnosis not present

## 2023-05-02 DIAGNOSIS — D2271 Melanocytic nevi of right lower limb, including hip: Secondary | ICD-10-CM | POA: Diagnosis not present

## 2023-05-09 ENCOUNTER — Other Ambulatory Visit: Payer: Medicare Other

## 2023-05-09 DIAGNOSIS — Z0000 Encounter for general adult medical examination without abnormal findings: Secondary | ICD-10-CM | POA: Diagnosis not present

## 2023-05-09 DIAGNOSIS — Z7901 Long term (current) use of anticoagulants: Secondary | ICD-10-CM

## 2023-05-09 DIAGNOSIS — Z13220 Encounter for screening for lipoid disorders: Secondary | ICD-10-CM | POA: Diagnosis not present

## 2023-05-09 DIAGNOSIS — Z23 Encounter for immunization: Secondary | ICD-10-CM | POA: Diagnosis not present

## 2023-05-09 DIAGNOSIS — Z79899 Other long term (current) drug therapy: Secondary | ICD-10-CM

## 2023-05-09 DIAGNOSIS — Z86718 Personal history of other venous thrombosis and embolism: Secondary | ICD-10-CM | POA: Diagnosis not present

## 2023-05-09 DIAGNOSIS — N419 Inflammatory disease of prostate, unspecified: Secondary | ICD-10-CM

## 2023-05-09 DIAGNOSIS — Z125 Encounter for screening for malignant neoplasm of prostate: Secondary | ICD-10-CM

## 2023-05-10 LAB — CBC WITH DIFFERENTIAL/PLATELET
Absolute Lymphocytes: 1398 {cells}/uL (ref 850–3900)
Absolute Monocytes: 406 {cells}/uL (ref 200–950)
Basophils Absolute: 41 {cells}/uL (ref 0–200)
Basophils Relative: 0.7 %
Eosinophils Absolute: 52 {cells}/uL (ref 15–500)
Eosinophils Relative: 0.9 %
HCT: 43.8 % (ref 38.5–50.0)
Hemoglobin: 14.8 g/dL (ref 13.2–17.1)
MCH: 31.6 pg (ref 27.0–33.0)
MCHC: 33.8 g/dL (ref 32.0–36.0)
MCV: 93.4 fL (ref 80.0–100.0)
MPV: 10.2 fL (ref 7.5–12.5)
Monocytes Relative: 7 %
Neutro Abs: 3903 {cells}/uL (ref 1500–7800)
Neutrophils Relative %: 67.3 %
Platelets: 177 10*3/uL (ref 140–400)
RBC: 4.69 10*6/uL (ref 4.20–5.80)
RDW: 12 % (ref 11.0–15.0)
Total Lymphocyte: 24.1 %
WBC: 5.8 10*3/uL (ref 3.8–10.8)

## 2023-05-10 LAB — PSA: PSA: 0.97 ng/mL (ref <=4.00)

## 2023-05-10 LAB — COMPLETE METABOLIC PANEL WITH GFR
AG Ratio: 1.8 (calc) (ref 1.0–2.5)
ALT: 22 U/L (ref 9–46)
AST: 21 U/L (ref 10–35)
Albumin: 4.3 g/dL (ref 3.6–5.1)
Alkaline phosphatase (APISO): 59 U/L (ref 35–144)
BUN: 11 mg/dL (ref 7–25)
CO2: 25 mmol/L (ref 20–32)
Calcium: 9.3 mg/dL (ref 8.6–10.3)
Chloride: 105 mmol/L (ref 98–110)
Creat: 0.88 mg/dL (ref 0.70–1.28)
Globulin: 2.4 g/dL (ref 1.9–3.7)
Glucose, Bld: 92 mg/dL (ref 65–99)
Potassium: 4.2 mmol/L (ref 3.5–5.3)
Sodium: 140 mmol/L (ref 135–146)
Total Bilirubin: 1.3 mg/dL — ABNORMAL HIGH (ref 0.2–1.2)
Total Protein: 6.7 g/dL (ref 6.1–8.1)
eGFR: 90 mL/min/{1.73_m2} (ref >=60)

## 2023-05-10 LAB — LIPID PANEL
Cholesterol: 93 mg/dL (ref <200)
HDL: 55 mg/dL (ref >=40)
LDL Cholesterol (Calc): 23 mg/dL
Non-HDL Cholesterol (Calc): 38 mg/dL (ref <130)
Total CHOL/HDL Ratio: 1.7 (calc) (ref <5.0)
Triglycerides: 69 mg/dL (ref <150)

## 2023-05-11 ENCOUNTER — Ambulatory Visit: Payer: Medicare Other | Admitting: Internal Medicine

## 2023-05-11 ENCOUNTER — Encounter: Payer: Self-pay | Admitting: Internal Medicine

## 2023-05-11 VITALS — BP 130/80 | HR 73 | Ht 69.0 in | Wt 168.0 lb

## 2023-05-11 DIAGNOSIS — Z86711 Personal history of pulmonary embolism: Secondary | ICD-10-CM | POA: Diagnosis not present

## 2023-05-11 DIAGNOSIS — D126 Benign neoplasm of colon, unspecified: Secondary | ICD-10-CM | POA: Diagnosis not present

## 2023-05-11 DIAGNOSIS — Z86718 Personal history of other venous thrombosis and embolism: Secondary | ICD-10-CM | POA: Diagnosis not present

## 2023-05-11 DIAGNOSIS — Z0000 Encounter for general adult medical examination without abnormal findings: Secondary | ICD-10-CM | POA: Diagnosis not present

## 2023-05-11 DIAGNOSIS — J301 Allergic rhinitis due to pollen: Secondary | ICD-10-CM | POA: Diagnosis not present

## 2023-05-11 DIAGNOSIS — Z7901 Long term (current) use of anticoagulants: Secondary | ICD-10-CM | POA: Diagnosis not present

## 2023-05-11 DIAGNOSIS — Z8249 Family history of ischemic heart disease and other diseases of the circulatory system: Secondary | ICD-10-CM | POA: Diagnosis not present

## 2023-05-11 LAB — POCT URINALYSIS DIP (CLINITEK)
Bilirubin, UA: NEGATIVE
Blood, UA: NEGATIVE
Glucose, UA: NEGATIVE mg/dL
Ketones, POC UA: NEGATIVE mg/dL
Leukocytes, UA: NEGATIVE
Nitrite, UA: NEGATIVE
POC PROTEIN,UA: NEGATIVE
Spec Grav, UA: 1.015 (ref 1.010–1.025)
Urobilinogen, UA: 0.2 U/dL
pH, UA: 6 (ref 5.0–8.0)

## 2023-05-11 NOTE — Patient Instructions (Signed)
It was a pleasure to see you today.  Labs are stable.  We discussed selecting a new Cardiologist as Dr. Eldridge Dace is moving out of state.Continue same meds and return in one year or as needed.

## 2023-05-16 ENCOUNTER — Other Ambulatory Visit: Payer: Self-pay | Admitting: Internal Medicine

## 2023-05-17 ENCOUNTER — Other Ambulatory Visit (HOSPITAL_BASED_OUTPATIENT_CLINIC_OR_DEPARTMENT_OTHER): Payer: Self-pay

## 2023-05-17 MED ORDER — AREXVY 120 MCG/0.5ML IM SUSR
INTRAMUSCULAR | 0 refills | Status: AC
  Filled 2023-05-17: qty 0.5, 1d supply, fill #0

## 2023-05-19 DIAGNOSIS — H524 Presbyopia: Secondary | ICD-10-CM | POA: Diagnosis not present

## 2023-05-19 DIAGNOSIS — H353131 Nonexudative age-related macular degeneration, bilateral, early dry stage: Secondary | ICD-10-CM | POA: Diagnosis not present

## 2023-05-19 DIAGNOSIS — H2513 Age-related nuclear cataract, bilateral: Secondary | ICD-10-CM | POA: Diagnosis not present

## 2023-06-19 ENCOUNTER — Other Ambulatory Visit: Payer: Self-pay | Admitting: Interventional Cardiology

## 2023-06-19 MED ORDER — ROSUVASTATIN CALCIUM 10 MG PO TABS: 10.0000 mg | ORAL_TABLET | Freq: Every day | ORAL | 0 refills | Status: DC

## 2023-06-20 ENCOUNTER — Encounter: Payer: Self-pay | Admitting: Internal Medicine

## 2023-06-23 ENCOUNTER — Other Ambulatory Visit: Payer: Self-pay | Admitting: Nurse Practitioner

## 2023-09-10 NOTE — Progress Notes (Unsigned)
  Cardiology Office Note:  .   Date:  09/11/2023  ID:  James Hughes, DOB [birth year], MRN [id number] PCP: Margaree Mackintosh, MD  [facility] Providers Cardiologist:  Lance Muss, MD    History of Present Illness: James Hughes   James Hughes is a 73 y.o. male.  Discussed the use of AI scribe software for clinical note transcription with the patient, who gave verbal consent to proceed.  History of Present Illness     ROS: negative except per HPI above.  Studies Reviewed: .        Results  Risk Assessment/Calculations:   {Does this patient have ATRIAL FIBRILLATION?[phone]} No BP recorded.  {Refresh Note OR Click here to enter BP  :1}***       Physical Exam:   VS:  There were no vitals taken for this visit.   Wt Readings from Last 3 Encounters:  05/11/23 168 lb (76.2 kg)  07/06/22 168 lb 3.2 oz (76.3 kg)  04/26/22 173 lb 1.9 oz (78.5 kg)     Physical Exam  GEN: Well nourished, well developed in no acute distress NECK: No JVD; No carotid bruits CARDIAC: ***RRR, no murmurs, rubs, gallops RESPIRATORY:  Clear to auscultation without rales, wheezing or rhonchi  ABDOMEN: Soft, non-tender, non-distended EXTREMITIES:  No edema; No deformity   ASSESSMENT AND PLAN: .    Assessment & Plan   Assessment and Plan Assessment & Plan         {Are you ordering a CV Procedure (e.g. stress test, cath, DCCV, TEE, etc)?   Press F2        :[id number]}   I spent *** minutes in the care of JULIANO MCEACHIN today including {CHL AMB CAR Time Based Billing Options STW (Optional):[phone]::"documenting in the encounter."}

## 2023-09-11 ENCOUNTER — Encounter: Payer: Self-pay | Admitting: Internal Medicine

## 2023-09-11 ENCOUNTER — Ambulatory Visit: Payer: Medicare Other | Attending: Internal Medicine | Admitting: Internal Medicine

## 2023-09-11 VITALS — BP 140/64 | HR 72 | Ht 70.0 in | Wt 168.0 lb

## 2023-09-11 DIAGNOSIS — I341 Nonrheumatic mitral (valve) prolapse: Secondary | ICD-10-CM | POA: Diagnosis present

## 2023-09-11 DIAGNOSIS — I10 Essential (primary) hypertension: Secondary | ICD-10-CM | POA: Diagnosis not present

## 2023-09-11 DIAGNOSIS — Z79899 Other long term (current) drug therapy: Secondary | ICD-10-CM | POA: Diagnosis present

## 2023-09-11 DIAGNOSIS — Z8249 Family history of ischemic heart disease and other diseases of the circulatory system: Secondary | ICD-10-CM | POA: Insufficient documentation

## 2023-09-11 DIAGNOSIS — Z7901 Long term (current) use of anticoagulants: Secondary | ICD-10-CM | POA: Diagnosis not present

## 2023-09-11 DIAGNOSIS — I2510 Atherosclerotic heart disease of native coronary artery without angina pectoris: Secondary | ICD-10-CM | POA: Diagnosis not present

## 2023-09-11 MED ORDER — ROSUVASTATIN CALCIUM 10 MG PO TABS
10.0000 mg | ORAL_TABLET | Freq: Every day | ORAL | 3 refills | Status: DC
  Filled 2023-12-26 – 2024-02-26 (×3): qty 90, 90d supply, fill #0
  Filled 2024-04-22: qty 90, 90d supply, fill #1

## 2023-09-11 NOTE — Patient Instructions (Signed)
 Medication Instructions:  No Changes *If you need a refill on your cardiac medications before your next appointment, please call your pharmacy*  Lab Work: None  Testing/Procedures: Your physician has requested that you have an echocardiogram. Echocardiography is a painless test that uses sound waves to create images of your heart. It provides your doctor with information about the size and shape of your heart and how well your heart's chambers and valves are working. This procedure takes approximately one hour. There are no restrictions for this procedure. Please do NOT wear cologne, perfume, aftershave, or lotions (deodorant is allowed). Please arrive 15 minutes prior to your appointment time.   Follow-Up: At [facility], you and your health needs are our priority.  As part of our continuing mission to provide you with exceptional heart care, our providers are all part of one team.  This team includes your primary Cardiologist (physician) and Advanced Practice Providers or APPs (Physician Assistants and Nurse Practitioners) who all work together to provide you with the care you need, when you need it.  Your next appointment:   1 year(s)  Provider:   Parke Poisson, MD      Other Instructions Please call us or send a MyChart message with any Cardiology related questions/concerns.  [phone].  Thank you!         Valet parking services will be available as well.

## 2023-10-16 ENCOUNTER — Ambulatory Visit (HOSPITAL_COMMUNITY): Attending: Cardiology

## 2023-10-16 DIAGNOSIS — I341 Nonrheumatic mitral (valve) prolapse: Secondary | ICD-10-CM | POA: Diagnosis not present

## 2023-10-16 LAB — ECHOCARDIOGRAM COMPLETE
Area-P 1/2: 4.31 cm2
S' Lateral: 2.5 cm

## 2023-10-26 DIAGNOSIS — L814 Other melanin hyperpigmentation: Secondary | ICD-10-CM | POA: Diagnosis not present

## 2023-10-26 DIAGNOSIS — D225 Melanocytic nevi of trunk: Secondary | ICD-10-CM | POA: Diagnosis not present

## 2023-10-26 DIAGNOSIS — D485 Neoplasm of uncertain behavior of skin: Secondary | ICD-10-CM | POA: Diagnosis not present

## 2023-10-26 DIAGNOSIS — Z86006 Personal history of melanoma in-situ: Secondary | ICD-10-CM | POA: Diagnosis not present

## 2023-10-26 DIAGNOSIS — L578 Other skin changes due to chronic exposure to nonionizing radiation: Secondary | ICD-10-CM | POA: Diagnosis not present

## 2023-10-26 DIAGNOSIS — Z86018 Personal history of other benign neoplasm: Secondary | ICD-10-CM | POA: Diagnosis not present

## 2023-10-26 DIAGNOSIS — D2271 Melanocytic nevi of right lower limb, including hip: Secondary | ICD-10-CM | POA: Diagnosis not present

## 2023-10-26 DIAGNOSIS — L821 Other seborrheic keratosis: Secondary | ICD-10-CM | POA: Diagnosis not present

## 2023-10-26 DIAGNOSIS — D2272 Melanocytic nevi of left lower limb, including hip: Secondary | ICD-10-CM | POA: Diagnosis not present

## 2023-10-26 DIAGNOSIS — D2372 Other benign neoplasm of skin of left lower limb, including hip: Secondary | ICD-10-CM | POA: Diagnosis not present

## 2023-11-16 ENCOUNTER — Other Ambulatory Visit: Payer: Self-pay | Admitting: Internal Medicine

## 2023-11-17 ENCOUNTER — Other Ambulatory Visit (HOSPITAL_BASED_OUTPATIENT_CLINIC_OR_DEPARTMENT_OTHER): Payer: Self-pay

## 2023-11-17 MED ORDER — ONDANSETRON 4 MG PO TBDP: 4.0000 mg | ORAL_TABLET | ORAL | 3 refills | Status: DC

## 2023-11-17 MED ORDER — TAMSULOSIN HCL 0.4 MG PO CAPS
0.8000 mg | ORAL_CAPSULE | Freq: Every day | ORAL | 3 refills | Status: AC
  Filled 2023-11-17: qty 180, 90d supply, fill #0

## 2023-11-17 MED ORDER — TAMSULOSIN HCL 0.4 MG PO CAPS
0.8000 mg | ORAL_CAPSULE | Freq: Every day | ORAL | 2 refills | Status: AC
  Filled 2023-12-28 – 2024-02-01 (×3): qty 180, 90d supply, fill #0
  Filled 2024-05-20: qty 180, 90d supply, fill #1

## 2023-11-17 MED ORDER — FINASTERIDE 5 MG PO TABS
5.0000 mg | ORAL_TABLET | Freq: Every day | ORAL | 3 refills | Status: AC
  Filled 2023-11-17: qty 90, 90d supply, fill #0
  Filled 2024-02-26: qty 90, 90d supply, fill #1
  Filled 2024-05-22: qty 90, 90d supply, fill #2

## 2023-11-17 MED ORDER — ROSUVASTATIN CALCIUM 10 MG PO TABS
10.0000 mg | ORAL_TABLET | Freq: Every day | ORAL | 0 refills | Status: DC
  Filled 2023-12-05: qty 90, 90d supply, fill #0

## 2023-12-05 ENCOUNTER — Other Ambulatory Visit (HOSPITAL_BASED_OUTPATIENT_CLINIC_OR_DEPARTMENT_OTHER): Payer: Self-pay

## 2023-12-25 ENCOUNTER — Encounter: Payer: Self-pay | Admitting: Internal Medicine

## 2023-12-25 ENCOUNTER — Ambulatory Visit (INDEPENDENT_AMBULATORY_CARE_PROVIDER_SITE_OTHER): Admitting: Internal Medicine

## 2023-12-25 ENCOUNTER — Other Ambulatory Visit (HOSPITAL_BASED_OUTPATIENT_CLINIC_OR_DEPARTMENT_OTHER): Payer: Self-pay

## 2023-12-25 VITALS — BP 130/78 | HR 86 | Ht 70.0 in | Wt 165.0 lb

## 2023-12-25 DIAGNOSIS — N410 Acute prostatitis: Secondary | ICD-10-CM | POA: Diagnosis not present

## 2023-12-25 DIAGNOSIS — Z86711 Personal history of pulmonary embolism: Secondary | ICD-10-CM

## 2023-12-25 DIAGNOSIS — Z7901 Long term (current) use of anticoagulants: Secondary | ICD-10-CM

## 2023-12-25 DIAGNOSIS — N401 Enlarged prostate with lower urinary tract symptoms: Secondary | ICD-10-CM

## 2023-12-25 DIAGNOSIS — R3911 Hesitancy of micturition: Secondary | ICD-10-CM | POA: Diagnosis not present

## 2023-12-25 LAB — POCT URINALYSIS DIP (CLINITEK)
Bilirubin, UA: NEGATIVE
Blood, UA: NEGATIVE
Glucose, UA: NEGATIVE mg/dL
Ketones, POC UA: NEGATIVE mg/dL
Leukocytes, UA: NEGATIVE
Nitrite, UA: NEGATIVE
Spec Grav, UA: 1.015 (ref 1.010–1.025)
Urobilinogen, UA: 0.2 U/dL
pH, UA: 6.5 (ref 5.0–8.0)

## 2023-12-25 MED ORDER — CEFTRIAXONE SODIUM 1 G IJ SOLR
1.0000 g | Freq: Once | INTRAMUSCULAR | Status: AC
  Administered 2023-12-25: 1 g via INTRAMUSCULAR

## 2023-12-25 MED ORDER — DOXYCYCLINE HYCLATE 100 MG PO TABS
100.0000 mg | ORAL_TABLET | Freq: Two times a day (BID) | ORAL | 0 refills | Status: DC
  Filled 2023-12-25: qty 20, 10d supply, fill #0

## 2023-12-25 NOTE — Progress Notes (Signed)
 Patient Care Team: Perri James PARAS, MD as PCP - General (Internal Medicine) Loni Soyla LABOR, MD as PCP - Cardiology (Cardiology)  Visit Date: 12/25/23  Subjective:   Chief Complaint  Patient presents with   Groin Pain   Patient PI:James Hughes,Male DOB:03-02-1951,73 y.o. FMW:[id number]   73 y.o.Male presents today for acute sick visit with symptoms of Prostatitis.Has had suprapubic  discomfort after recent long car trip.Patient has a past medical history of Prostatitis; Elevated LFTs; PE; White Coat Hypertension. Taking Proscar  5 mg daily and Flomax  prescribed by Dr. Selma, Urologist for Prostatism. In December 2024, he had a normal UA and a PSA of 0.97.   Past Medical History:  Diagnosis Date   Elevated liver enzymes 04/29/2017   Prostatitis 04/29/2017   Pulmonary embolism (HCC) 03/18/2017   White coat syndrome with hypertension 03/18/2017    Allergies  Allergen Reactions   Bacitracin-Polymyxin B Other (See Comments)   Penicillins Hives    Has patient had a PCN reaction causing immediate rash, facial/tongue/throat swelling, SOB or lightheadedness with hypotension: No Has patient had a PCN reaction causing severe rash involving mucus membranes or skin necrosis: No Has patient had a PCN reaction that required hospitalization: No Has patient had a PCN reaction occurring within the last 10 years: No If all of the above answers are NO, then may proceed with Cephalosporin use.    Sulfa Antibiotics Hives   Sulfur Other (See Comments)    Family History  Problem Relation Age of Onset   Cancer Mother    Pulmonary embolism Father    Social Hx: Retired. Married no children. 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  Review of Systems  All other systems reviewed and are negative. Per the HPI  Has malaise. No vomiting. Decreased energy. No shaking chills. Objective:  Vitals: BP 130/78   Pulse 86   Ht 5' 10 (1.778 m)   Wt 165  lb (74.8 kg)   SpO2 98%   BMI 23.68 kg/m   Physical Exam Vitals and nursing note reviewed.  Constitutional:      General: He is not in acute distress.    Appearance: Normal appearance. He is not ill-appearing.  HENT:     Head: Normocephalic and atraumatic.  Pulmonary:     Effort: Pulmonary effort is normal.  Genitourinary:    Prostate: Enlarged (slightly) and tender (slightly).  Skin:    General: Skin is warm and dry.  Neurological:     Mental Status: He is alert and oriented to person, place, and time. Mental status is at baseline.  Psychiatric:        Mood and Affect: Mood normal.        Behavior: Behavior normal.        Thought Content: Thought content normal.        Judgment: Judgment normal.     Results:  Studies Obtained And Personally Reviewed By Me: Labs:     Component Value Date/Time   NA 140 05/09/2023 0914   K 4.2 05/09/2023 0914   CL 105 05/09/2023 0914   CO2 25 05/09/2023 0914   GLUCOSE 92 05/09/2023 0914   BUN 11 05/09/2023 0914   CREATININE 0.88 05/09/2023 0914   CALCIUM  9.3 05/09/2023 0914   PROT 6.7 05/09/2023 0914   PROT 6.5 10/03/2022 0824   ALBUMIN 4.4 10/03/2022 0824   AST 21 05/09/2023 0914   ALT 22 05/09/2023 0914   ALKPHOS 70 10/03/2022 0824  BILITOT 1.3 (H) 05/09/2023 0914   BILITOT 1.6 (H) 10/03/2022 0824   GFRNONAA 80 04/17/2020 0927   GFRAA 93 04/17/2020 0927    Lab Results  Component Value Date   WBC 5.8 05/09/2023   HGB 14.8 05/09/2023   HCT 43.8 05/09/2023   MCV 93.4 05/09/2023   PLT 177 05/09/2023   Lab Results  Component Value Date   CHOL 93 05/09/2023   HDL 55 05/09/2023   LDLCALC 23 05/09/2023   TRIG 69 05/09/2023   CHOLHDL 1.7 05/09/2023   Lab Results  Component Value Date   HGBA1C 5.6 04/21/2017    Lab Results  Component Value Date   PSA 0.97 05/09/2023   PSA 1.12 04/25/2022   PSA 1.05 04/19/2021     Assessment & Plan:   Meds ordered this encounter  Medications   cefTRIAXone  (ROCEPHIN ) injection 1 g    doxycycline  (VIBRA -TABS) 100 MG tablet    Sig: Take 1 tablet (100 mg total) by mouth 2 (two) times daily.    Dispense:  20 tablet    Refill:  0   Orders Placed This Encounter  Procedures   Urine Culture   CBC   POCT URINALYSIS DIP (CLINITEK)   Acute Prostatitis: already Taking Proscar  5 mg daily and Flomax  prescribed by Dr. Selma, Urologist for Prostatism. In December 2024, he had a normal UA and a PSA of 0.97. UA today w/o white cells - sending for culture. Prostate boggy on exam. Given Rocephin  1 g IM. Ordering CBC. Sending in 100 mg Doxycycline  - take 1 tablet (100 mg total) by mouth 2 (two) times daily to [facility] per patient request.       I,Emily Lagle,acting as a scribe for James JINNY Hailstone, MD.,have documented all relevant documentation on the behalf of James JINNY Hailstone, MD,as directed by  James JINNY Hailstone, MD while in the presence of James JINNY Hailstone, MD.   I, James JINNY Hailstone, MD, have reviewed all documentation for this visit. The documentation on 12/25/23 for the exam, diagnosis, procedures, and orders are all accurate and complete.

## 2023-12-25 NOTE — Patient Instructions (Addendum)
 We are sorry you are not feeling well. CBC with diff drawn today. Given one gram IM Rocephin  in office and started on Doxycycline  100 mg twice daily for 10 days.Call if not improving in 48 hours or sooner if worse.

## 2023-12-26 ENCOUNTER — Ambulatory Visit: Payer: Self-pay | Admitting: Internal Medicine

## 2023-12-26 ENCOUNTER — Other Ambulatory Visit (HOSPITAL_BASED_OUTPATIENT_CLINIC_OR_DEPARTMENT_OTHER): Payer: Self-pay

## 2023-12-26 LAB — URINE CULTURE
MICRO NUMBER:: 16724059
Result:: NO GROWTH
SPECIMEN QUALITY:: ADEQUATE

## 2023-12-26 LAB — CBC
HCT: 41.5 % (ref 38.5–50.0)
Hemoglobin: 13.8 g/dL (ref 13.2–17.1)
MCH: 31.5 pg (ref 27.0–33.0)
MCHC: 33.3 g/dL (ref 32.0–36.0)
MCV: 94.7 fL (ref 80.0–100.0)
MPV: 9.9 fL (ref 7.5–12.5)
Platelets: 160 Thousand/uL (ref 140–400)
RBC: 4.38 Million/uL (ref 4.20–5.80)
RDW: 11.8 % (ref 11.0–15.0)
WBC: 12.9 Thousand/uL — ABNORMAL HIGH (ref 3.8–10.8)

## 2023-12-26 MED FILL — Rivaroxaban Tab 10 MG: ORAL | 90 days supply | Qty: 90 | Fill #0 | Status: CN

## 2023-12-28 ENCOUNTER — Other Ambulatory Visit (HOSPITAL_BASED_OUTPATIENT_CLINIC_OR_DEPARTMENT_OTHER): Payer: Self-pay

## 2023-12-28 MED FILL — Rivaroxaban Tab 10 MG: ORAL | 90 days supply | Qty: 90 | Fill #0 | Status: CN

## 2024-01-09 ENCOUNTER — Ambulatory Visit: Payer: Self-pay | Admitting: Internal Medicine

## 2024-02-01 ENCOUNTER — Other Ambulatory Visit (HOSPITAL_COMMUNITY): Payer: Self-pay

## 2024-02-01 ENCOUNTER — Other Ambulatory Visit (HOSPITAL_BASED_OUTPATIENT_CLINIC_OR_DEPARTMENT_OTHER): Payer: Self-pay

## 2024-02-01 MED FILL — Rivaroxaban Tab 10 MG: ORAL | 90 days supply | Qty: 90 | Fill #0 | Status: CN

## 2024-02-03 ENCOUNTER — Other Ambulatory Visit (HOSPITAL_BASED_OUTPATIENT_CLINIC_OR_DEPARTMENT_OTHER): Payer: Self-pay

## 2024-02-03 MED FILL — Rivaroxaban Tab 10 MG: ORAL | 90 days supply | Qty: 90 | Fill #0 | Status: AC

## 2024-02-07 ENCOUNTER — Other Ambulatory Visit (HOSPITAL_BASED_OUTPATIENT_CLINIC_OR_DEPARTMENT_OTHER): Payer: Self-pay

## 2024-02-07 DIAGNOSIS — Z23 Encounter for immunization: Secondary | ICD-10-CM | POA: Diagnosis not present

## 2024-02-07 MED ORDER — FLUZONE HIGH-DOSE 0.5 ML IM SUSY
0.5000 mL | PREFILLED_SYRINGE | Freq: Once | INTRAMUSCULAR | 0 refills | Status: AC
  Filled 2024-02-07: qty 0.5, 1d supply, fill #0

## 2024-02-13 ENCOUNTER — Other Ambulatory Visit: Payer: Self-pay

## 2024-02-13 ENCOUNTER — Other Ambulatory Visit (HOSPITAL_BASED_OUTPATIENT_CLINIC_OR_DEPARTMENT_OTHER): Payer: Self-pay

## 2024-02-13 MED ORDER — COVID-19 MRNA VACC (MODERNA) 50 MCG/0.5ML IM SUSP
0.5000 mL | Freq: Once | INTRAMUSCULAR | 0 refills | Status: AC
  Filled 2024-02-13: qty 0.5, 1d supply, fill #0

## 2024-02-19 ENCOUNTER — Other Ambulatory Visit (HOSPITAL_BASED_OUTPATIENT_CLINIC_OR_DEPARTMENT_OTHER): Payer: Self-pay

## 2024-02-19 DIAGNOSIS — Z23 Encounter for immunization: Secondary | ICD-10-CM | POA: Diagnosis not present

## 2024-02-19 MED ORDER — COMIRNATY 30 MCG/0.3ML IM SUSY
0.3000 mL | PREFILLED_SYRINGE | Freq: Once | INTRAMUSCULAR | 0 refills | Status: AC
  Filled 2024-02-19: qty 0.3, 1d supply, fill #0

## 2024-02-26 ENCOUNTER — Other Ambulatory Visit (HOSPITAL_COMMUNITY): Payer: Self-pay

## 2024-02-26 ENCOUNTER — Other Ambulatory Visit (HOSPITAL_BASED_OUTPATIENT_CLINIC_OR_DEPARTMENT_OTHER): Payer: Self-pay

## 2024-02-26 ENCOUNTER — Other Ambulatory Visit: Payer: Self-pay

## 2024-04-02 ENCOUNTER — Other Ambulatory Visit (HOSPITAL_BASED_OUTPATIENT_CLINIC_OR_DEPARTMENT_OTHER): Payer: Self-pay

## 2024-04-02 DIAGNOSIS — R350 Frequency of micturition: Secondary | ICD-10-CM | POA: Diagnosis not present

## 2024-04-02 DIAGNOSIS — R351 Nocturia: Secondary | ICD-10-CM | POA: Diagnosis not present

## 2024-04-02 DIAGNOSIS — N401 Enlarged prostate with lower urinary tract symptoms: Secondary | ICD-10-CM | POA: Diagnosis not present

## 2024-04-02 DIAGNOSIS — R3912 Poor urinary stream: Secondary | ICD-10-CM | POA: Diagnosis not present

## 2024-04-02 DIAGNOSIS — N400 Enlarged prostate without lower urinary tract symptoms: Secondary | ICD-10-CM | POA: Diagnosis not present

## 2024-04-02 DIAGNOSIS — R3914 Feeling of incomplete bladder emptying: Secondary | ICD-10-CM | POA: Diagnosis not present

## 2024-04-02 MED ORDER — TAMSULOSIN HCL 0.4 MG PO CAPS
0.4000 mg | ORAL_CAPSULE | Freq: Every day | ORAL | 3 refills | Status: AC
  Filled 2024-04-02: qty 90, 90d supply, fill #0

## 2024-04-02 MED ORDER — FINASTERIDE 5 MG PO TABS
5.0000 mg | ORAL_TABLET | Freq: Every day | ORAL | 3 refills | Status: AC
  Filled 2024-04-02: qty 90, 90d supply, fill #0

## 2024-04-22 ENCOUNTER — Other Ambulatory Visit (HOSPITAL_BASED_OUTPATIENT_CLINIC_OR_DEPARTMENT_OTHER): Payer: Self-pay

## 2024-04-25 ENCOUNTER — Other Ambulatory Visit (HOSPITAL_BASED_OUTPATIENT_CLINIC_OR_DEPARTMENT_OTHER): Payer: Self-pay

## 2024-04-25 MED ORDER — ONDANSETRON 4 MG PO TBDP
4.0000 mg | ORAL_TABLET | ORAL | 3 refills | Status: AC
Start: 2024-04-25 — End: ?
  Filled 2024-04-25: qty 2, 1d supply, fill #0
  Filled 2024-05-01: qty 2, 1d supply, fill #1

## 2024-04-26 ENCOUNTER — Other Ambulatory Visit (HOSPITAL_BASED_OUTPATIENT_CLINIC_OR_DEPARTMENT_OTHER): Payer: Self-pay

## 2024-04-29 ENCOUNTER — Other Ambulatory Visit: Payer: Self-pay | Admitting: Nurse Practitioner

## 2024-04-29 ENCOUNTER — Other Ambulatory Visit (HOSPITAL_BASED_OUTPATIENT_CLINIC_OR_DEPARTMENT_OTHER): Payer: Self-pay

## 2024-04-29 DIAGNOSIS — Z8249 Family history of ischemic heart disease and other diseases of the circulatory system: Secondary | ICD-10-CM

## 2024-04-29 DIAGNOSIS — I2510 Atherosclerotic heart disease of native coronary artery without angina pectoris: Secondary | ICD-10-CM

## 2024-04-30 ENCOUNTER — Other Ambulatory Visit (HOSPITAL_BASED_OUTPATIENT_CLINIC_OR_DEPARTMENT_OTHER): Payer: Self-pay

## 2024-04-30 MED FILL — Rosuvastatin Calcium Tab 10 MG: ORAL | 90 days supply | Qty: 90 | Fill #0 | Status: CN
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
## 2024-05-01 ENCOUNTER — Other Ambulatory Visit: Payer: Self-pay

## 2024-05-01 MED FILL — Rivaroxaban Tab 10 MG: ORAL | 90 days supply | Qty: 90 | Fill #1 | Status: AC

## 2024-05-03 ENCOUNTER — Other Ambulatory Visit: Payer: Self-pay

## 2024-05-03 ENCOUNTER — Other Ambulatory Visit (HOSPITAL_BASED_OUTPATIENT_CLINIC_OR_DEPARTMENT_OTHER): Payer: Self-pay

## 2024-05-06 DIAGNOSIS — D2271 Melanocytic nevi of right lower limb, including hip: Secondary | ICD-10-CM | POA: Diagnosis not present

## 2024-05-06 DIAGNOSIS — L578 Other skin changes due to chronic exposure to nonionizing radiation: Secondary | ICD-10-CM | POA: Diagnosis not present

## 2024-05-06 DIAGNOSIS — Z86006 Personal history of melanoma in-situ: Secondary | ICD-10-CM | POA: Diagnosis not present

## 2024-05-06 DIAGNOSIS — Z86018 Personal history of other benign neoplasm: Secondary | ICD-10-CM | POA: Diagnosis not present

## 2024-05-06 DIAGNOSIS — L821 Other seborrheic keratosis: Secondary | ICD-10-CM | POA: Diagnosis not present

## 2024-05-06 DIAGNOSIS — L814 Other melanin hyperpigmentation: Secondary | ICD-10-CM | POA: Diagnosis not present

## 2024-05-06 DIAGNOSIS — D225 Melanocytic nevi of trunk: Secondary | ICD-10-CM | POA: Diagnosis not present

## 2024-05-07 ENCOUNTER — Other Ambulatory Visit: Payer: BLUE CROSS/BLUE SHIELD

## 2024-05-07 DIAGNOSIS — Z79899 Other long term (current) drug therapy: Secondary | ICD-10-CM

## 2024-05-07 DIAGNOSIS — N401 Enlarged prostate with lower urinary tract symptoms: Secondary | ICD-10-CM | POA: Diagnosis not present

## 2024-05-07 DIAGNOSIS — Z0000 Encounter for general adult medical examination without abnormal findings: Secondary | ICD-10-CM | POA: Diagnosis not present

## 2024-05-07 DIAGNOSIS — Z13220 Encounter for screening for lipoid disorders: Secondary | ICD-10-CM

## 2024-05-07 DIAGNOSIS — R3911 Hesitancy of micturition: Secondary | ICD-10-CM | POA: Diagnosis not present

## 2024-05-08 LAB — COMPREHENSIVE METABOLIC PANEL WITH GFR
AG Ratio: 2.2 (calc) (ref 1.0–2.5)
ALT: 26 U/L (ref 9–46)
AST: 24 U/L (ref 10–35)
Albumin: 4.3 g/dL (ref 3.6–5.1)
Alkaline phosphatase (APISO): 53 U/L (ref 35–144)
BUN: 13 mg/dL (ref 7–25)
CO2: 24 mmol/L (ref 20–32)
Calcium: 9.1 mg/dL (ref 8.6–10.3)
Chloride: 104 mmol/L (ref 98–110)
Creat: 0.89 mg/dL (ref 0.70–1.28)
Globulin: 2 g/dL (ref 1.9–3.7)
Glucose, Bld: 101 mg/dL — ABNORMAL HIGH (ref 65–99)
Potassium: 4.1 mmol/L (ref 3.5–5.3)
Sodium: 141 mmol/L (ref 135–146)
Total Bilirubin: 1.1 mg/dL (ref 0.2–1.2)
Total Protein: 6.3 g/dL (ref 6.1–8.1)
eGFR: 89 mL/min/1.73m2 (ref >=60)

## 2024-05-08 LAB — CBC WITH DIFFERENTIAL/PLATELET
Absolute Lymphocytes: 1569 {cells}/uL (ref 850–3900)
Absolute Monocytes: 434 {cells}/uL (ref 200–950)
Basophils Absolute: 19 {cells}/uL (ref 0–200)
Basophils Relative: 0.3 %
Eosinophils Absolute: 31 {cells}/uL (ref 15–500)
Eosinophils Relative: 0.5 %
HCT: 43.9 % (ref 39.4–51.1)
Hemoglobin: 14.4 g/dL (ref 13.2–17.1)
MCH: 30.8 pg (ref 27.0–33.0)
MCHC: 32.8 g/dL (ref 31.6–35.4)
MCV: 93.8 fL (ref 81.4–101.7)
MPV: 10.2 fL (ref 7.5–12.5)
Monocytes Relative: 7 %
Neutro Abs: 4148 {cells}/uL (ref 1500–7800)
Neutrophils Relative %: 66.9 %
Platelets: 178 Thousand/uL (ref 140–400)
RBC: 4.68 Million/uL (ref 4.20–5.80)
RDW: 12.2 % (ref 11.0–15.0)
Total Lymphocyte: 25.3 %
WBC: 6.2 Thousand/uL (ref 3.8–10.8)

## 2024-05-08 LAB — LIPID PANEL
Cholesterol: 89 mg/dL (ref <200)
HDL: 58 mg/dL (ref >=40)
LDL Cholesterol (Calc): 18 mg/dL
Non-HDL Cholesterol (Calc): 31 mg/dL (ref <130)
Total CHOL/HDL Ratio: 1.5 (calc) (ref <5.0)
Triglycerides: 58 mg/dL (ref <150)

## 2024-05-08 LAB — PSA: PSA: 0.99 ng/mL (ref <=4.00)

## 2024-05-13 ENCOUNTER — Ambulatory Visit: Payer: BLUE CROSS/BLUE SHIELD | Admitting: Internal Medicine

## 2024-05-13 ENCOUNTER — Encounter: Payer: Self-pay | Admitting: Internal Medicine

## 2024-05-13 VITALS — BP 130/70 | HR 80 | Ht 70.0 in | Wt 163.0 lb

## 2024-05-13 DIAGNOSIS — E785 Hyperlipidemia, unspecified: Secondary | ICD-10-CM | POA: Diagnosis not present

## 2024-05-13 DIAGNOSIS — R338 Other retention of urine: Secondary | ICD-10-CM | POA: Diagnosis not present

## 2024-05-13 DIAGNOSIS — Z7901 Long term (current) use of anticoagulants: Secondary | ICD-10-CM

## 2024-05-13 DIAGNOSIS — N401 Enlarged prostate with lower urinary tract symptoms: Secondary | ICD-10-CM

## 2024-05-13 DIAGNOSIS — Z86711 Personal history of pulmonary embolism: Secondary | ICD-10-CM | POA: Diagnosis not present

## 2024-05-13 DIAGNOSIS — Z86718 Personal history of other venous thrombosis and embolism: Secondary | ICD-10-CM

## 2024-05-13 DIAGNOSIS — Z0000 Encounter for general adult medical examination without abnormal findings: Secondary | ICD-10-CM | POA: Diagnosis not present

## 2024-05-13 NOTE — Patient Instructions (Addendum)
 James Hughes,  Thank you for taking the time for your Medicare Wellness Visit. I appreciate your continued commitment to your health goals. Please review the care plan we discussed, and feel free to reach out if I can assist you further.  Please note that Annual Wellness Visits do not include a physical exam. Some assessments may be limited, especially if the visit was conducted virtually. If needed, we may recommend an in-person follow-up with your provider.  Ongoing Care Seeing your primary care provider every  12  months helps us  monitor your health and provide consistent, personalized care.   Referrals If a referral was made during today's visit and you haven't received any updates within two weeks, please contact the referred provider directly to check on the status.  Recommended Screenings:  Health Maintenance  Topic Date Due   COVID-19 Vaccine (10 - Pfizer risk 2025-26 season) 08/18/2024   Medicare Annual Wellness Visit  05/13/2025   Colon Cancer Screening  03/20/2028   DTaP/Tdap/Td vaccine (3 - Td or Tdap) 04/27/2032   Pneumococcal Vaccine for age over 62  Completed   Flu Shot  Completed   Zoster (Shingles) Vaccine  Completed   Meningitis B Vaccine  Aged Out   Hepatitis C Screening  Discontinued       05/13/2024    9:53 AM  Advanced Directives  Does Patient Have a Medical Advance Directive? Yes  Type of Advance Directive Living will;Healthcare Power of Attorney  Does patient want to make changes to medical advance directive? No - Patient declined  Copy of Healthcare Power of Attorney in Chart? Yes - validated most recent copy scanned in chart (See row information)    Vision: Annual vision screenings are recommended for early detection of glaucoma, cataracts, and diabetic retinopathy. These exams can also reveal signs of chronic conditions such as diabetes and high blood pressure.  Dental: Annual dental screenings help detect early signs of oral cancer, gum disease, and  other conditions linked to overall health, including heart disease and diabetes.  Please see the attached documents for additional preventive care recommendations.  we

## 2024-05-13 NOTE — Progress Notes (Unsigned)
 Chief Complaint  Patient presents with   Annual Exam     Subjective:   James Hughes is a 73 y.o. male who presents for a Medicare Annual Wellness Visit.  Visit info / Clinical Intake: Medicare Wellness Visit Type:: Subsequent Annual Wellness Visit Persons participating in visit and providing information:: patient Medicare Wellness Visit Mode:: In-person (required for WTM) Interpreter Needed?: No Pre-visit prep was completed: yes AWV questionnaire completed by patient prior to visit?: no Living arrangements:: lives with spouse/significant other Patient's Overall Health Status Rating: excellent Typical amount of pain: none Does pain affect daily life?: no Are you currently prescribed opioids?: no  Dietary Habits and Nutritional Risks How many meals a day?: 3 Eats fruit and vegetables daily?: yes Most meals are obtained by: eating out; preparing own meals In the last 2 weeks, have you had any of the following?: none Diabetic:: no  Functional Status Activities of Daily Living (to include ambulation/medication): Independent Ambulation: Independent Medication Administration: Independent Home Management (perform basic housework or laundry): Independent Manage your own finances?: yes Primary transportation is: driving Concerns about vision?: no *vision screening is required for WTM* Concerns about hearing?: no  Fall Screening Falls in the past year?: 0 Number of falls in past year: 0 Was there an injury with Fall?: 0 Fall Risk Category Calculator: 0 Patient Fall Risk Level: Low Fall Risk  Fall Risk Patient at Risk for Falls Due to: No Fall Risks Fall risk Follow up: Falls prevention discussed; Education provided; Falls evaluation completed  Home and Transportation Safety: All rugs have non-skid backing?: yes Grab bars in the bathtub or shower?: (!) no Have non-skid surface in bathtub or shower?: yes Good home lighting?: yes Regular seat belt use?: yes Hospital  stays in the last year:: no  Cognitive Assessment Difficulty concentrating, remembering, or making decisions? : no Will 6CIT or Mini Cog be Completed: yes What year is it?: 0 points What month is it?: 0 points About what time is it?: 0 points Count backwards from 20 to 1: 0 points Say the months of the year in reverse: 0 points Repeat the address phrase from earlier: 0 points 6 CIT Score: 0 points  Advance Directives (For Healthcare) Does Patient Have a Medical Advance Directive?: Yes Does patient want to make changes to medical advance directive?: No - Patient declined Type of Advance Directive: Living will; Healthcare Power of Attorney Copy of Healthcare Power of Attorney in Chart?: Yes - validated most recent copy scanned in chart (See row information) Copy of Living Will in Chart?: Yes - validated most recent copy scanned in chart (See row information)  Reviewed/Updated  Reviewed/Updated: Reviewed All (Medical, Surgical, Family, Medications, Allergies, Care Teams, Patient Goals)    Allergies (verified) Bacitracin-polymyxin b, Penicillins, Sulfa antibiotics, and Sulfur   Current Medications (verified) Outpatient Encounter Medications as of 05/13/2024  Medication Sig   cetirizine (ZYRTEC) 10 MG tablet Take 5 mg by mouth daily as needed for allergies.   fexofenadine (ALLEGRA) 180 MG tablet Take 180 mg by mouth daily.   finasteride  (PROSCAR ) 5 MG tablet Take 5 mg by mouth daily.   finasteride  (PROSCAR ) 5 MG tablet Take 1 tablet (5 mg total) by mouth daily.   finasteride  (PROSCAR ) 5 MG tablet Take 1 tablet (5 mg total) by mouth daily.   Multiple Vitamins-Minerals (PRESERVISION AREDS 2 PO) Take by mouth.   ofloxacin  (OCUFLOX ) 0.3 % ophthalmic solution Place 2 drops into both eyes 4 (four) times daily.   ondansetron  (ZOFRAN -ODT) 4 MG  disintegrating tablet Take 4 mg by mouth every 8 (eight) hours as needed.   ondansetron  (ZOFRAN -ODT) 4 MG disintegrating tablet Take 1 tablet (4 mg  total) by mouth as directed 1 hour before dental visit. Will make you drowsy, have someone drive and pick you up.   polyethylene glycol-electrolytes (NULYTELY) 420 g solution Use as directed with Dulcolax.   rivaroxaban  (XARELTO ) 10 MG TABS tablet Take 1 tablet (10 mg total) by mouth daily.   rosuvastatin  (CRESTOR ) 10 MG tablet Take 1 tablet (10 mg total) by mouth daily.   RSV vaccine recomb adjuvanted (AREXVY ) 120 MCG/0.5ML injection Inject into the muscle.   tamsulosin  (FLOMAX ) 0.4 MG CAPS capsule Take 1 capsule (0.4 mg total) by mouth daily. (Patient taking differently: Take 0.8 mg by mouth daily.)   tamsulosin  (FLOMAX ) 0.4 MG CAPS capsule Take 2 capsules (0.8 mg total) by mouth at bedtime.   tamsulosin  (FLOMAX ) 0.4 MG CAPS capsule Take 2 capsules (0.8 mg total) by mouth at bedtime.   tamsulosin  (FLOMAX ) 0.4 MG CAPS capsule Take 1 capsule (0.4 mg total) by mouth daily.   [DISCONTINUED] doxycycline  (VIBRA -TABS) 100 MG tablet Take 1 tablet (100 mg total) by mouth 2 (two) times daily.   [DISCONTINUED] rosuvastatin  (CRESTOR ) 10 MG tablet Take 1 tablet (10 mg total) by mouth daily.   [DISCONTINUED] rosuvastatin  (CRESTOR ) 10 MG tablet Take 1 tablet (10 mg total) by mouth daily.   No facility-administered encounter medications on file as of 05/13/2024.    History: Past Medical History:  Diagnosis Date   Elevated liver enzymes 04/29/2017   Prostatitis 04/29/2017   Pulmonary embolism (HCC) 03/18/2017   White coat syndrome with hypertension 03/18/2017   No past surgical history on file. Family History  Problem Relation Age of Onset   Cancer Mother    Pulmonary embolism Father    Social History   Occupational History   Not on file  Tobacco Use   Smoking status: Never   Smokeless tobacco: Never  Vaping Use   Vaping status: Never Used  Substance and Sexual Activity   Alcohol use: Yes    Alcohol/week: 3.0 standard drinks of alcohol    Types: 3 Glasses of wine per week    Comment:  Sometimes.   Drug use: No   Sexual activity: Not on file   Tobacco Counseling Counseling given: No  SDOH Screenings   Food Insecurity: No Food Insecurity (05/13/2024)  Housing: Low Risk  (05/13/2024)  Transportation Needs: No Transportation Needs (05/13/2024)  Utilities: Not At Risk (05/13/2024)  Alcohol Screen: Low Risk  (05/13/2024)  Depression (PHQ2-9): Low Risk  (05/13/2024)  Financial Resource Strain: Low Risk  (05/13/2024)  Physical Activity: Sufficiently Active (05/13/2024)  Social Connections: Socially Integrated (05/13/2024)  Stress: No Stress Concern Present (05/13/2024)  Tobacco Use: Low Risk  (05/13/2024)  Health Literacy: Adequate Health Literacy (05/13/2024)   See flowsheets for full screening details  Depression Screen PHQ 2 & 9 Depression Scale- Over the past 2 weeks, how often have you been bothered by any of the following problems? Little interest or pleasure in doing things: 0 Feeling down, depressed, or hopeless (PHQ Adolescent also includes...irritable): 0 PHQ-2 Total Score: 0 Trouble falling or staying asleep, or sleeping too much: 0 Feeling tired or having little energy: 0 Poor appetite or overeating (PHQ Adolescent also includes...weight loss): 0 Feeling bad about yourself - or that you are a failure or have let yourself or your family down: 0 Trouble concentrating on things, such as reading the newspaper or  watching television (PHQ Adolescent also includes...like school work): 0 Moving or speaking so slowly that other people could have noticed. Or the opposite - being so fidgety or restless that you have been moving around a lot more than usual: 0 Thoughts that you would be better off dead, or of hurting yourself in some way: 0 If you checked off any problems, how difficult have these problems made it for you to do your work, take care of things at home, or get along with other people?: Not difficult at all     Goals Addressed   None          Objective:     Today's Vitals   05/13/24 0947  BP: 130/70  Pulse: 80  SpO2: 98%  Weight: 163 lb (73.9 kg)  Height: 5' 10 (1.778 m)  PainSc: 0-No pain   Body mass index is 23.39 kg/m.  Hearing/Vision screen Vision Screening - Comments:: Patient states he is up to date with his yearly eye exam. Last eye exam was at [facility] Immunizations and Health Maintenance Health Maintenance  Topic Date Due   COVID-19 Vaccine (10 - Pfizer risk 2025-26 season) 08/18/2024   Medicare Annual Wellness (AWV)  05/13/2025   Colonoscopy  03/20/2028   DTaP/Tdap/Td (3 - Td or Tdap) 04/27/2032   Pneumococcal Vaccine: 50+ Years  Completed   Influenza Vaccine  Completed   Zoster Vaccines- Shingrix  Completed   Meningococcal B Vaccine  Aged Out   Hepatitis C Screening  Discontinued        Assessment/Plan:  This is a routine wellness examination for James Hughes.  Patient Care Team: Perri Ronal PARAS, MD as PCP - General (Internal Medicine) Loni Soyla LABOR, MD as PCP - Cardiology (Cardiology)  I have personally reviewed and noted the following in the patients chart:   Medical and social history Use of alcohol, tobacco or illicit drugs  Current medications and supplements including opioid prescriptions. Functional ability and status Nutritional status Physical activity Advanced directives List of other physicians Hospitalizations, surgeries, and ER visits in previous 12 months Vitals Screenings to include cognitive, depression, and falls Referrals and appointments   In addition, I have reviewed and discussed with patient certain preventive protocols, quality metrics, and best practice recommendations. A written personalized care plan for preventive services as well as general preventive health recommendations were provided to patient.   Zahra Peffley Zelda, CMA   05/13/2024   Return in 1 year (on 05/13/2025).  After Visit Summary: (In Person-Printed) AVS printed and given to the patient  Nurse  Notes: none  I, Ronal PARAS Perri, MD, have reviewed all documentation for this visit. The documentation on 05/13/2024 for the exam, diagnosis, procedures, and orders are all accurate and complete.

## 2024-05-19 ENCOUNTER — Encounter: Payer: Self-pay | Admitting: Internal Medicine

## 2024-05-20 ENCOUNTER — Other Ambulatory Visit (HOSPITAL_BASED_OUTPATIENT_CLINIC_OR_DEPARTMENT_OTHER): Payer: Self-pay

## 2024-05-20 MED FILL — Rosuvastatin Calcium Tab 10 MG: ORAL | 90 days supply | Qty: 90 | Fill #0 | Status: AC

## 2024-05-23 ENCOUNTER — Other Ambulatory Visit (HOSPITAL_BASED_OUTPATIENT_CLINIC_OR_DEPARTMENT_OTHER): Payer: Self-pay

## 2024-07-04 ENCOUNTER — Ambulatory Visit: Admitting: Internal Medicine

## 2024-07-04 ENCOUNTER — Ambulatory Visit: Payer: Self-pay | Admitting: Internal Medicine

## 2024-07-04 ENCOUNTER — Encounter: Payer: Self-pay | Admitting: Internal Medicine

## 2024-07-04 VITALS — BP 140/80 | HR 94 | Temp 99.2°F | Ht 70.0 in | Wt 163.0 lb

## 2024-07-04 DIAGNOSIS — N401 Enlarged prostate with lower urinary tract symptoms: Secondary | ICD-10-CM

## 2024-07-04 DIAGNOSIS — R1030 Lower abdominal pain, unspecified: Secondary | ICD-10-CM

## 2024-07-04 LAB — CBC WITH DIFFERENTIAL/PLATELET
Absolute Lymphocytes: 1373 {cells}/uL (ref 850–3900)
Absolute Monocytes: 460 {cells}/uL (ref 200–950)
Basophils Absolute: 32 {cells}/uL (ref 0–200)
Basophils Relative: 0.5 %
Eosinophils Absolute: 19 {cells}/uL (ref 15–500)
Eosinophils Relative: 0.3 %
HCT: 43 % (ref 39.4–51.1)
Hemoglobin: 14.4 g/dL (ref 13.2–17.1)
MCH: 31.3 pg (ref 27.0–33.0)
MCHC: 33.5 g/dL (ref 31.6–35.4)
MCV: 93.5 fL (ref 81.4–101.7)
MPV: 9.9 fL (ref 7.5–12.5)
Monocytes Relative: 7.3 %
Neutro Abs: 4416 {cells}/uL (ref 1500–7800)
Neutrophils Relative %: 70.1 %
Platelets: 191 10*3/uL (ref 140–400)
RBC: 4.6 Million/uL (ref 4.20–5.80)
RDW: 12.1 % (ref 11.0–15.0)
Total Lymphocyte: 21.8 %
WBC: 6.3 10*3/uL (ref 3.8–10.8)

## 2024-07-04 LAB — POCT URINALYSIS DIP (CLINITEK)
Bilirubin, UA: NEGATIVE
Blood, UA: NEGATIVE
Glucose, UA: NEGATIVE mg/dL
Ketones, POC UA: NEGATIVE mg/dL
Leukocytes, UA: NEGATIVE
Nitrite, UA: NEGATIVE
POC PROTEIN,UA: NEGATIVE
Spec Grav, UA: 1.025
Urobilinogen, UA: 0.2 U/dL
pH, UA: 6
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
## 2024-07-04 NOTE — Patient Instructions (Addendum)
 Referral to [facility] for evaluation of right groin pain. He will be seen tomorrow by Dr. Curvin.

## 2024-07-05 ENCOUNTER — Ambulatory Visit: Payer: Self-pay | Admitting: General Surgery

## 2024-07-12 ENCOUNTER — Encounter: Payer: Self-pay | Admitting: Internal Medicine

## 2024-07-12 ENCOUNTER — Ambulatory Visit: Admitting: Internal Medicine

## 2024-07-12 VITALS — BP 144/80 | HR 102 | Ht 70.0 in | Wt 161.0 lb

## 2024-07-12 DIAGNOSIS — Z01818 Encounter for other preprocedural examination: Secondary | ICD-10-CM

## 2024-07-12 DIAGNOSIS — N401 Enlarged prostate with lower urinary tract symptoms: Secondary | ICD-10-CM

## 2024-07-12 DIAGNOSIS — Z7901 Long term (current) use of anticoagulants: Secondary | ICD-10-CM

## 2024-07-12 DIAGNOSIS — K4090 Unilateral inguinal hernia, without obstruction or gangrene, not specified as recurrent: Secondary | ICD-10-CM

## 2024-07-12 DIAGNOSIS — Z86718 Personal history of other venous thrombosis and embolism: Secondary | ICD-10-CM

## 2024-07-12 DIAGNOSIS — Z86711 Personal history of pulmonary embolism: Secondary | ICD-10-CM
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
## 2024-07-12 NOTE — Patient Instructions (Signed)
 Patient is medically cleared for right inguinal hernia repair by Dr. Curvin. Patient is to discontinue Xarelto  2 days prior to surgery.

## 2024-09-11 ENCOUNTER — Ambulatory Visit: Admitting: Internal Medicine

## 2025-05-16 ENCOUNTER — Ambulatory Visit: Admitting: Internal Medicine

## 2025-05-20 ENCOUNTER — Other Ambulatory Visit

## 2025-05-23 ENCOUNTER — Ambulatory Visit: Admitting: Internal Medicine
